# Patient Record
Sex: Male | Born: 1990 | Race: Black or African American | Hispanic: No | Marital: Single | Smoking: Current every day smoker
Health system: Southern US, Community
[De-identification: ages and names within clinical notes are randomized; demographics above are authoritative.]

---

## 2017-06-10 ENCOUNTER — Other Ambulatory Visit: Payer: Self-pay

## 2017-06-10 ENCOUNTER — Emergency Department (HOSPITAL_BASED_OUTPATIENT_CLINIC_OR_DEPARTMENT_OTHER): Payer: Self-pay

## 2017-06-10 ENCOUNTER — Inpatient Hospital Stay (HOSPITAL_BASED_OUTPATIENT_CLINIC_OR_DEPARTMENT_OTHER)
Admission: EM | Admit: 2017-06-10 | Discharge: 2017-06-12 | DRG: 683 | Disposition: A | Discharge status: Home / Self Care | Payer: Self-pay | Attending: Family Medicine | Admitting: Family Medicine

## 2017-06-10 ENCOUNTER — Encounter (HOSPITAL_BASED_OUTPATIENT_CLINIC_OR_DEPARTMENT_OTHER): Payer: Self-pay | Admitting: Emergency Medicine

## 2017-06-10 DIAGNOSIS — N19 Unspecified kidney failure: Secondary | ICD-10-CM

## 2017-06-10 DIAGNOSIS — M6282 Rhabdomyolysis: Secondary | ICD-10-CM | POA: Diagnosis present

## 2017-06-10 DIAGNOSIS — E860 Dehydration: Secondary | ICD-10-CM | POA: Diagnosis present

## 2017-06-10 DIAGNOSIS — F17200 Nicotine dependence, unspecified, uncomplicated: Secondary | ICD-10-CM | POA: Diagnosis present

## 2017-06-10 DIAGNOSIS — N179 Acute kidney failure, unspecified: Principal | ICD-10-CM | POA: Diagnosis present

## 2017-06-10 LAB — CBC WITH DIFFERENTIAL/PLATELET
BASOS ABS: 0.1 10*3/uL (ref 0.0–0.1)
BASOS PCT: 1 %
EOS PCT: 1 %
Eosinophils Absolute: 0.1 10*3/uL (ref 0.0–0.7)
HCT: 50.4 % (ref 39.0–52.0)
Hemoglobin: 18.1 g/dL — ABNORMAL HIGH (ref 13.0–17.0)
Lymphocytes Relative: 23 %
Lymphs Abs: 2.2 10*3/uL (ref 0.7–4.0)
MCH: 32.4 pg (ref 26.0–34.0)
MCHC: 35.9 g/dL (ref 30.0–36.0)
MCV: 90.3 fL (ref 78.0–100.0)
MONO ABS: 0.8 10*3/uL (ref 0.1–1.0)
Monocytes Relative: 8 %
Neutro Abs: 6.5 10*3/uL (ref 1.7–7.7)
Neutrophils Relative %: 67 %
PLATELETS: 308 10*3/uL (ref 150–400)
RBC: 5.58 MIL/uL (ref 4.22–5.81)
RDW: 12.2 % (ref 11.5–15.5)
WBC: 9.7 10*3/uL (ref 4.0–10.5)

## 2017-06-10 LAB — LIPASE, BLOOD: LIPASE: 30 U/L (ref 11–51)

## 2017-06-10 LAB — COMPREHENSIVE METABOLIC PANEL
ALBUMIN: 5.7 g/dL — AB (ref 3.5–5.0)
ALT: 28 U/L (ref 17–63)
ANION GAP: 17 — AB (ref 5–15)
AST: 42 U/L — AB (ref 15–41)
Alkaline Phosphatase: 85 U/L (ref 38–126)
BUN: 18 mg/dL (ref 6–20)
CHLORIDE: 96 mmol/L — AB (ref 101–111)
CO2: 22 mmol/L (ref 22–32)
Calcium: 10.7 mg/dL — ABNORMAL HIGH (ref 8.9–10.3)
Creatinine, Ser: 2.56 mg/dL — ABNORMAL HIGH (ref 0.61–1.24)
GFR calc Af Amer: 38 mL/min — ABNORMAL LOW (ref >60)
GFR, EST NON AFRICAN AMERICAN: 33 mL/min — AB (ref >60)
GLUCOSE: 116 mg/dL — AB (ref 65–99)
POTASSIUM: 3.6 mmol/L (ref 3.5–5.1)
Sodium: 135 mmol/L (ref 135–145)
Total Bilirubin: 1.8 mg/dL — ABNORMAL HIGH (ref 0.3–1.2)
Total Protein: 9.6 g/dL — ABNORMAL HIGH (ref 6.5–8.1)

## 2017-06-10 MED ORDER — ONDANSETRON 4 MG PO TBDP
4.0000 mg | ORAL_TABLET | Freq: Once | ORAL | Status: DC
  Administered 2017-06-10: 4 mg via ORAL
  Filled 2017-06-10: qty 1

## 2017-06-10 MED ORDER — ONDANSETRON HCL 4 MG/2ML IJ SOLN
4.0000 mg | Freq: Once | INTRAMUSCULAR | Status: AC
  Administered 2017-06-10: 4 mg via INTRAVENOUS
  Filled 2017-06-10: qty 2

## 2017-06-10 MED ORDER — FENTANYL CITRATE (PF) 100 MCG/2ML IJ SOLN
50.0000 ug | Freq: Once | INTRAMUSCULAR | Status: AC
  Administered 2017-06-10: 50 ug via INTRAVENOUS
  Filled 2017-06-10: qty 2

## 2017-06-10 MED ORDER — SODIUM CHLORIDE 0.9 % IV BOLUS (SEPSIS)
1000.0000 mL | Freq: Once | INTRAVENOUS | Status: AC
  Administered 2017-06-10: 1000 mL via INTRAVENOUS

## 2017-06-10 NOTE — ED Provider Notes (Signed)
TIME SEEN: 11:12 PM  CHIEF COMPLAINT: Abdominal pain  HPI: Patient is a 27 year old male with no significant past medical history who presents to the emergency department with sharp right lower quadrant pain that started at 6 PM while at work.  Has had nausea but no vomiting.  No diarrhea.  No fevers or chills.  No dysuria or hematuria.  No previous abdominal surgeries.  No history of kidney stones.  No aggravating or alleviating factors.  ROS: See HPI Constitutional: no fever  Eyes: no drainage  ENT: no runny nose   Cardiovascular:  no chest pain  Resp: no SOB  GI: no vomiting GU: no dysuria Integumentary: no rash  Allergy: no hives  Musculoskeletal: no leg swelling  Neurological: no slurred speech ROS otherwise negative  PAST MEDICAL HISTORY/PAST SURGICAL HISTORY:  History reviewed. No pertinent past medical history.  MEDICATIONS:  Prior to Admission medications   Not on File    ALLERGIES:  Allergies  Allergen Reactions  . Penicillins     SOCIAL HISTORY:  Social History   Tobacco Use  . Smoking status: Current Every Day Smoker  . Smokeless tobacco: Never Used  Substance Use Topics  . Alcohol use: Yes    Frequency: Never    FAMILY HISTORY: No family history on file.  EXAM: BP 107/80 (BP Location: Left Arm)   Pulse 85   Temp 97.6 F (36.4 C) (Oral)   Resp 20   Ht 5\' 8"  (1.727 m)   Wt 86.2 kg (190 lb)   SpO2 100%   BMI 28.89 kg/m  CONSTITUTIONAL: Alert and oriented and responds appropriately to questions.  Appears slightly uncomfortable but afebrile and nontoxic-appearing. HEAD: Normocephalic EYES: Conjunctivae clear, pupils appear equal, EOMI ENT: normal nose; moist mucous membranes NECK: Supple, no meningismus, no nuchal rigidity, no LAD  CARD: RRR; S1 and S2 appreciated; no murmurs, no clicks, no rubs, no gallops RESP: Normal chest excursion without splinting or tachypnea; breath sounds clear and equal bilaterally; no wheezes, no rhonchi, no rales,  no hypoxia or respiratory distress, speaking full sentences ABD/GI: Normal bowel sounds; non-distended; soft, tender in the right lower quadrant, no rebound, no guarding, no peritoneal signs, no hepatosplenomegaly BACK:  The back appears normal and is non-tender to palpation, there is no CVA tenderness EXT: Normal ROM in all joints; non-tender to palpation; no edema; normal capillary refill; no cyanosis, no calf tenderness or swelling    SKIN: Normal color for age and race; warm; no rash NEURO: Moves all extremities equally PSYCH: The patient's mood and manner are appropriate. Grooming and personal hygiene are appropriate.  MEDICAL DECISION MAKING: Patient here with right lower quadrant pain.  Sudden onset which makes appendicitis less likely but still in the differential.  Kidney stone, UTI, pyelonephritis also on the differential.  Will obtain labs, urine and a CT of the abdomen pelvis.  Will give IV fluids, pain and nausea medicine.  ED PROGRESS: Patient's urine shows trace hemoglobin, proteinuria.  No sign of infection.  His labs do appear hemoconcentrated and he has a creatinine of 2.56 with a normal BUN.  There is no old for comparison.  His blood pressure is normal.  He has no known history of chronic kidney disease.  CT scan without contrast has now been ordered.  Will avoid any nephrotoxic medications.  We will continue IV hydration.  1:55 AM Pt's CT scan shows no acute abnormality.  No appendicitis.  Kidneys appear normal.  No stones noted.  He denies any known  history of renal failure.  Blood work drawn was in [employer] before 2016.  Denies heavy NSAID use recently.  States he does use ibuprofen intermittently for headaches.  States he has been eating and drinking well.  No vomiting or diarrhea.  We will continue IV hydration.  Have recommended admission for further workup for his renal failure.  Patient and mother comfortable with this plan.  Recommend transfer to [facility].   2:38 AM Discussed patient's case with hospitalist, Dr. Antionette Char.  I have recommended admission and patient (and family if present) agree with this plan. Admitting physician will place admission orders.   I reviewed all nursing notes, vitals, pertinent previous records, EKGs, lab and urine results, imaging (as available).     Ward, Layla Maw, DO 06/11/17 [phone]

## 2017-06-10 NOTE — ED Triage Notes (Signed)
PT presents with c/o RLQ pain since 6pm.

## 2017-06-11 ENCOUNTER — Emergency Department (HOSPITAL_BASED_OUTPATIENT_CLINIC_OR_DEPARTMENT_OTHER): Payer: Self-pay

## 2017-06-11 DIAGNOSIS — N179 Acute kidney failure, unspecified: Secondary | ICD-10-CM | POA: Diagnosis present

## 2017-06-11 DIAGNOSIS — E860 Dehydration: Secondary | ICD-10-CM

## 2017-06-11 DIAGNOSIS — N19 Unspecified kidney failure: Secondary | ICD-10-CM

## 2017-06-11 DIAGNOSIS — R1031 Right lower quadrant pain: Secondary | ICD-10-CM

## 2017-06-11 LAB — URINALYSIS, ROUTINE W REFLEX MICROSCOPIC
Bilirubin Urine: NEGATIVE
Glucose, UA: NEGATIVE mg/dL
Glucose, UA: NEGATIVE mg/dL
HGB URINE DIPSTICK: NEGATIVE
KETONES UR: 20 mg/dL — AB
Ketones, ur: 15 mg/dL — AB
LEUKOCYTES UA: NEGATIVE
Leukocytes, UA: NEGATIVE
NITRITE: NEGATIVE
NITRITE: NEGATIVE
PROTEIN: 100 mg/dL — AB
PROTEIN: NEGATIVE mg/dL
SPECIFIC GRAVITY, URINE: 1.011 (ref 1.005–1.030)
Specific Gravity, Urine: >1.03 — ABNORMAL HIGH (ref 1.005–1.030)
pH: 5.5 (ref 5.0–8.0)
pH: 6 (ref 5.0–8.0)

## 2017-06-11 LAB — COMPREHENSIVE METABOLIC PANEL
ALBUMIN: 3.4 g/dL — AB (ref 3.5–5.0)
ALT: 21 U/L (ref 17–63)
ANION GAP: 10 (ref 5–15)
AST: 32 U/L (ref 15–41)
Alkaline Phosphatase: 60 U/L (ref 38–126)
BILIRUBIN TOTAL: 1.7 mg/dL — AB (ref 0.3–1.2)
BUN: 11 mg/dL (ref 6–20)
CHLORIDE: 106 mmol/L (ref 101–111)
CO2: 21 mmol/L — ABNORMAL LOW (ref 22–32)
Calcium: 8 mg/dL — ABNORMAL LOW (ref 8.9–10.3)
Creatinine, Ser: 1.49 mg/dL — ABNORMAL HIGH (ref 0.61–1.24)
GFR calc Af Amer: >60 mL/min (ref >60)
Glucose, Bld: 103 mg/dL — ABNORMAL HIGH (ref 65–99)
POTASSIUM: 3.1 mmol/L — AB (ref 3.5–5.1)
Sodium: 137 mmol/L (ref 135–145)
TOTAL PROTEIN: 5.9 g/dL — AB (ref 6.5–8.1)

## 2017-06-11 LAB — URINALYSIS, MICROSCOPIC (REFLEX): WBC UA: NONE SEEN WBC/hpf (ref 0–5)

## 2017-06-11 LAB — PROTEIN / CREATININE RATIO, URINE
CREATININE, URINE: 125.14 mg/dL
Total Protein, Urine: <6 mg/dL

## 2017-06-11 LAB — PROTIME-INR
INR: 1.14
Prothrombin Time: 14.5 seconds (ref 11.4–15.2)

## 2017-06-11 LAB — SODIUM, URINE, RANDOM: Sodium, Ur: 37 mmol/L

## 2017-06-11 LAB — CK: CK TOTAL: 1943 U/L — AB (ref 49–397)

## 2017-06-11 LAB — APTT: APTT: 31 s (ref 24–36)

## 2017-06-11 LAB — CREATININE, URINE, RANDOM: Creatinine, Urine: 946.36 mg/dL

## 2017-06-11 MED ORDER — SODIUM CHLORIDE 0.9 % IV SOLN: INTRAVENOUS | Status: DC

## 2017-06-11 MED ORDER — SODIUM CHLORIDE 0.9 % IV BOLUS (SEPSIS)
1000.0000 mL | Freq: Once | INTRAVENOUS | Status: AC
  Administered 2017-06-11: 1000 mL via INTRAVENOUS

## 2017-06-11 MED ORDER — SODIUM CHLORIDE 0.9 % IV BOLUS (SEPSIS)
2000.0000 mL | Freq: Once | INTRAVENOUS | Status: AC
  Administered 2017-06-11: 2000 mL via INTRAVENOUS

## 2017-06-11 MED ORDER — ONDANSETRON HCL 4 MG/2ML IJ SOLN: 4.0000 mg | Freq: Four times a day (QID) | INTRAMUSCULAR | Status: DC | PRN

## 2017-06-11 MED ORDER — ACETAMINOPHEN 325 MG PO TABS: 650.0000 mg | ORAL_TABLET | Freq: Four times a day (QID) | ORAL | Status: DC | PRN

## 2017-06-11 MED ORDER — PROMETHAZINE HCL 25 MG/ML IJ SOLN: 12.5000 mg | Freq: Four times a day (QID) | INTRAMUSCULAR | Status: DC | PRN

## 2017-06-11 MED ORDER — SODIUM CHLORIDE 0.9 % IV SOLN
INTRAVENOUS | Status: DC
  Administered 2017-06-11: 02:00:00 via INTRAVENOUS

## 2017-06-11 MED ORDER — FENTANYL CITRATE (PF) 100 MCG/2ML IJ SOLN: 50.0000 ug | INTRAMUSCULAR | Status: DC | PRN

## 2017-06-11 MED ORDER — NICOTINE 21 MG/24HR TD PT24
21.0000 mg | MEDICATED_PATCH | Freq: Every day | TRANSDERMAL | Status: DC
  Administered 2017-06-11 – 2017-06-12 (×2): 21 mg via TRANSDERMAL
  Filled 2017-06-11 (×2): qty 1

## 2017-06-11 NOTE — ED Notes (Signed)
Phones are down at [facility].

## 2017-06-11 NOTE — Consult Note (Signed)
[facility]  [facility] Consult Note     Date: 06/11/2017                  Patient Name:  Russell Cook  MRN: [id number]  DOB: 09-29-1990  Age / Sex: 27 y.o., male         PCP: Patient, No Pcp Per                 Service Requesting Consult: [facility]                 Reason for Consult: AKI            Chief Complaint: RLQ pain  HPI:  27 year old male with no past medical history presents with acute onset right lower quadrant cramping pain that does not radiate, and associated bilateral upper and lower extremity cramping.  He was found to have an acute kidney injury with creatinine of 2.56 in the emergency department and therefore the renal service was consulted.  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  He endorses drinking 3 x 16 ounces of water yesterday at work, however he did not urinate at work all day.  He reports that he typically does not urinate at work all day because he does not like using the public restroom.  After pain came on he does endorse nausea but no vomiting.  He has not noticed any upper or lower extremity edema.  He denies any recent NSAID use.   The patient reports that his sister is seeing a hematologist for infertility issues, and so question of whether lupus is possible has come up.  Patient does deny any history of rash, photosensitivity, oral ulcers, or arthritis.  He is denying chest pain to think of pleuritis or pericarditis.  He denies family history of clotting disorders. He denies having any symptoms prior to the onset of his cramping right lower quadrant pain, which resolved after receiving fentanyl in the emergency department.  He is not currently having pain.  History reviewed. No pertinent past medical history.  History reviewed. No pertinent surgical history.  No family history on file. Social History:  reports that he has been smoking.  he has never used smokeless  tobacco. He reports that he drinks alcohol. He reports that he uses drugs. Drug: Marijuana.  Allergies:  Allergies  Allergen Reactions  . Penicillins     No medications prior to admission.    Results for orders placed or performed during the hospital encounter of 06/10/17 (from the past 48 hour(s))  Urinalysis, Routine w reflex microscopic     Status: Abnormal   Collection Time: 06/10/17 11:05 PM  Result Value Ref Range   Color, Urine ORANGE (A) YELLOW    Comment: BIOCHEMICALS MAY BE AFFECTED BY COLOR   APPearance CLOUDY (A) CLEAR   Specific Gravity, Urine >1.030 (H) 1.005 - 1.030   pH 5.5 5.0 - 8.0   Glucose, UA NEGATIVE NEGATIVE mg/dL   Hgb urine dipstick TRACE (A) NEGATIVE   Bilirubin Urine MODERATE (A) NEGATIVE   Ketones, ur 15 (A) NEGATIVE mg/dL   Protein, ur 100 (A) NEGATIVE mg/dL   Nitrite NEGATIVE NEGATIVE   Leukocytes, UA NEGATIVE NEGATIVE    Comment: Performed at [facility], [address] [place], [place] [address]  CBC with Differential     Status: Abnormal   Collection Time: 06/10/17 11:05 PM  Result Value  Ref Range   WBC 9.7 4.0 - 10.5 K/uL   RBC 5.58 4.22 - 5.81 MIL/uL   Hemoglobin 18.1 (H) 13.0 - 17.0 g/dL   HCT 50.4 39.0 - 52.0 %   MCV 90.3 78.0 - 100.0 fL   MCH 32.4 26.0 - 34.0 pg   MCHC 35.9 30.0 - 36.0 g/dL   RDW 12.2 11.5 - 15.5 %   Platelets 308 150 - 400 K/uL   Neutrophils Relative % 67 %   Neutro Abs 6.5 1.7 - 7.7 K/uL   Lymphocytes Relative 23 %   Lymphs Abs 2.2 0.7 - 4.0 K/uL   Monocytes Relative 8 %   Monocytes Absolute 0.8 0.1 - 1.0 K/uL   Eosinophils Relative 1 %   Eosinophils Absolute 0.1 0.0 - 0.7 K/uL   Basophils Relative 1 %   Basophils Absolute 0.1 0.0 - 0.1 K/uL    Comment: Performed at [facility], [address] [place], [place] [address]  Comprehensive metabolic panel     Status: Abnormal   Collection Time: 06/10/17 11:05 PM  Result Value Ref Range   Sodium 135 135 - 145 mmol/L   Potassium 3.6 3.5  - 5.1 mmol/L   Chloride 96 (L) 101 - 111 mmol/L   CO2 22 22 - 32 mmol/L   Glucose, Bld 116 (H) 65 - 99 mg/dL   BUN 18 6 - 20 mg/dL   Creatinine, Ser 2.56 (H) 0.61 - 1.24 mg/dL   Calcium 10.7 (H) 8.9 - 10.3 mg/dL   Total Protein 9.6 (H) 6.5 - 8.1 g/dL   Albumin 5.7 (H) 3.5 - 5.0 g/dL   AST 42 (H) 15 - 41 U/L   ALT 28 17 - 63 U/L   Alkaline Phosphatase 85 38 - 126 U/L   Total Bilirubin 1.8 (H) 0.3 - 1.2 mg/dL   GFR calc non Af Amer 33 (L) >60 mL/min   GFR calc Af Amer 38 (L) >60 mL/min    Comment: (NOTE) The eGFR has been calculated using the CKD EPI equation. This calculation has not been validated in all clinical situations. eGFR's persistently <60 mL/min signify possible Chronic Kidney Disease.    Anion gap 17 (H) 5 - 15    Comment: Performed at [facility], [address] [place], [place] [address]  Lipase, blood     Status: None   Collection Time: 06/10/17 11:05 PM  Result Value Ref Range   Lipase 30 11 - 51 U/L    Comment: Performed at [facility], [address] [place], [place] [address]  Urinalysis, Microscopic (reflex)     Status: Abnormal   Collection Time: 06/10/17 11:05 PM  Result Value Ref Range   RBC / HPF 0-5 0 - 5 RBC/hpf   WBC, UA NONE SEEN 0 - 5 WBC/hpf   Bacteria, UA RARE (A) NONE SEEN   Squamous Epithelial / LPF 6-30 (A) NONE SEEN   Hyaline Casts, UA PRESENT    Ca Oxalate Crys, UA PRESENT     Comment: Performed at [facility], [address] [place], [place] [address]   Ct Abdomen Pelvis Wo Contrast  Result Date: 06/11/2017 CLINICAL DATA:  Acute onset of right lower quadrant abdominal pain. EXAM: CT ABDOMEN AND PELVIS WITHOUT CONTRAST TECHNIQUE: Multidetector CT imaging of the abdomen and pelvis was performed following the standard protocol without IV contrast. COMPARISON:  None. FINDINGS: Lower chest: The visualized lung bases are grossly clear.  The visualized portions of the mediastinum are unremarkable.  Hepatobiliary: The liver is unremarkable in appearance. The gallbladder is unremarkable in appearance. The common bile duct remains normal in caliber. Pancreas: The pancreas is within normal limits. Spleen: The spleen is unremarkable in appearance. Adrenals/Urinary Tract: The adrenal glands are unremarkable in appearance. The kidneys are within normal limits. There is no evidence of hydronephrosis. No renal or ureteral stones are identified. No perinephric stranding is seen. A right-sided extrarenal pelvis is noted. Stomach/Bowel: The stomach is unremarkable in appearance. The small bowel is within normal limits. The appendix is normal in caliber, without evidence of appendicitis. The colon is unremarkable in appearance. Vascular/Lymphatic: The abdominal aorta is unremarkable in appearance. The inferior vena cava is grossly unremarkable. No retroperitoneal lymphadenopathy is seen. No pelvic sidewall lymphadenopathy is identified. Reproductive: The bladder is mildly distended and grossly unremarkable. The prostate remains normal in size. Other: No additional soft tissue abnormalities are seen. Musculoskeletal: No acute osseous abnormalities are identified. The visualized musculature is unremarkable in appearance. IMPRESSION: Unremarkable noncontrast CT of the abdomen and pelvis. No evidence of appendicitis. Electronically Signed   By: Garald Balding M.D.   On: 06/11/2017 01:34    ROS See HPI for ROS  Blood pressure (!) 110/54, pulse 72, temperature 98.5 F (36.9 C), temperature source Oral, resp. rate 17, height 5' 8" (1.727 m), weight 190 lb (86.2 kg), SpO2 97 %. Physical Exam  GEN: NAD, rests comfortably in bed, answers questions appropraitely NECK: Full ROM, no thyromegally RESPIRATORY: clear to auscultation bilaterally with no wheezes, rhonchi or rales, good effort CV: RRR, no m/r/g, no peripheral edema in UE or LE bilaterally GI: Soft, mildly tender to very deep palpation in RLQ, otherwise non  tender, non-distended, normoactive bowel sounds, no hepatosplenomegaly. GU: No CVA tenderness SKIN: warm and dry, no rashes or lesions NEURO: II-XII grossly intact PSYCH: AAOx3, appropriate affect  Assessment/Plan 1. AKI: Cr elevated at 2.56 on admission with BUN 18. Electrolytes are WNL. Uncertain etiology of AKI however considerations include rhabdomyolysis given history of physical labor, also urine is orange on UA which is positive for trace Hgb a, 15 ketones and 100 mg/dL protein. Also considering nephrolithiasis - less likely with normal renal CT. Given family history of possible antiphospholipid-Ab syndrome, SLE is on the differential, however patient is not having any symptoms of SLE.  - will await lab results including ANA, CK, CMP from today - continue IV hydration, currently NS at 125 ml/hr - will calculate FeNa when labs result - urine protein:creatinine ratio ordered  Everrett Coombe, MD PGY2

## 2017-06-11 NOTE — H&P (Signed)
History and Physical:    Russell Cook   FAO:[id number] DOB: 04/26/1991 DOA: 06/10/2017  Referring MD/provider: Dr Antionette Char PCP: Patient, No Pcp Per   Patient coming from: Home  Chief Complaint: Right lower quadrant pain and acute renal failure  History of Present Illness:   Russell Cook is an 27 y.o. male with no significant past medical history was in his usual state of health until 6 PM last night when he noted onset of right lower quadrant pain. Patient thought he pulled a muscle try to stretch it out without good effect. Over the course of an hour pain intensified to the point where he had to lie on the floor to fetal position. He was diaphoretic and nauseated but was unable to have any emesis. Notes area of pain increased in size however there was no radiation to the back, to his groin, to his shoulders or to his chest. Notes pain did not resolve until he received fentanyl in the ER. Patient notes that he has had reasonable urine output although does note that his previous urine was dark yellow/orange in color but denies that it was brown or red.  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 He notes that he keeps himself well hydrated. Denies muscle aches or cramps in this job however does note he had to quit his previous job due to frequent cramps in his upper and lower extremities.   Patient's only drug uses marijuana which he smokes one blunt a day. Denies cocaine or heroin use. Denies any IV drug use ever. Does not drink alcohol and has not been sexually active for over a year.  Family history is notable for a sister who is undergoing infertility workup. She is seeing a hematologist. There is no history of clotting disorder or bleeding disorder in the family per mother. No history of unexplained miscarriages. No history of lupus or any other neurological disease.   ED Course:  The patient was treated with fentanyl for pain. He underwent a CT which did not show  appendicitis. He was noted to have an elevated creatinine at 2.6. Patient denied any previous knowledge of kidney issues.  ROS:   ROS   No fevers or chills. No cough. No recent waist changes. No rash. No chest pain or shortness of breath. No dysuria. No back pain. No dizziness or syncope or presyncope. No easy bruisability or bleeding.  Past Medical History:   History reviewed. No pertinent past medical history.  Past Surgical History:   History reviewed. No pertinent surgical history.  Social History:   Social History   Socioeconomic History  . Marital status: Single    Spouse name: Not on file  . Number of children: Not on file  . Years of education: Not on file  . Highest education level: Not on file  Social Needs  . Financial resource strain: Not on file  . Food insecurity - worry: Not on file  . Food insecurity - inability: Not on file  . Transportation needs - medical: Not on file  . Transportation needs - non-medical: Not on file  Occupational History  . Not on file  Tobacco Use  . Smoking status: Current Every Day Smoker  . Smokeless tobacco: Never Used  Substance and Sexual Activity  . Alcohol use: Yes    Frequency: Never  . Drug use: Yes    Types: Marijuana  . Sexual activity: Not on file  Other Topics Concern  .  Not on file  Social History Narrative  . Not on file    Allergies   Penicillins  Family history:   No family history on file.  Current Medications:   Prior to Admission medications   Not on File    Physical Exam:   Vitals:   06/11/17 0308 06/11/17 0406 06/11/17 0554 06/11/17 0653  BP: (!) 95/43 (!) 104/58 (!) 106/49 (!) 110/54  Pulse: 71 62 90 72  Resp: 16 16 16 17   Temp: 98.1 F (36.7 C)  98 F (36.7 C) 98.5 F (36.9 C)  TempSrc: Oral  Oral Oral  SpO2:  97% 98% 97%  Weight:      Height:         Physical Exam: Blood pressure (!) 110/54, pulse 72, temperature 98.5 F (36.9 C), temperature source Oral, resp. rate 17,  height 5\' 8"  (1.727 m), weight 86.2 kg (190 lb), SpO2 97 %. Gen: Well-appearing man lying comfortably in bed watching TV. Eyes: Sclerae anicteric to my exam. Conjunctiva mildly injected. Neck: Supple, no jugular venous distention. Chest: Moderately good air entry bilaterally with no adventitious sounds.  CV: Regular, normal S1 and S2. Abdomen: NABS, soft, nondistended, nontender. He may have had minimal tenderness to deep palpation in the right mid abdomen area. No CVA tenderness. No flank tenderness. No rebound. No guarding. Extremities: No edema.  Skin: No rashes or petechiae noted. Neuro: Alert and oriented times 3; grossly nonfocal. Psych: Patient is cooperative, logical and coherent with appropriate mood and affect.  Data Review:    Labs: Basic Metabolic Panel: Recent Labs  Lab 06/10/17 2305  NA 135  K 3.6  CL 96*  CO2 22  GLUCOSE 116*  BUN 18  CREATININE 2.56*  CALCIUM 10.7*   Liver Function Tests: Recent Labs  Lab 06/10/17 2305  AST 42*  ALT 28  ALKPHOS 85  BILITOT 1.8*  PROT 9.6*  ALBUMIN 5.7*   Recent Labs  Lab 06/10/17 2305  LIPASE 30   No results for input(s): AMMONIA in the last 168 hours. CBC: Recent Labs  Lab 06/10/17 2305  WBC 9.7  NEUTROABS 6.5  HGB 18.1*  HCT 50.4  MCV 90.3  PLT 308   Cardiac Enzymes: No results for input(s): CKTOTAL, CKMB, CKMBINDEX, TROPONINI in the last 168 hours.  BNP (last 3 results) No results for input(s): PROBNP in the last 8760 hours. CBG: No results for input(s): GLUCAP in the last 168 hours.  Urinalysis    Component Value Date/Time   COLORURINE ORANGE (A) 06/10/2017 2305   APPEARANCEUR CLOUDY (A) 06/10/2017 2305   LABSPEC >1.030 (H) 06/10/2017 2305   PHURINE 5.5 06/10/2017 2305   GLUCOSEU NEGATIVE 06/10/2017 2305   HGBUR TRACE (A) 06/10/2017 2305   BILIRUBINUR MODERATE (A) 06/10/2017 2305   KETONESUR 15 (A) 06/10/2017 2305   PROTEINUR 100 (A) 06/10/2017 2305   NITRITE NEGATIVE 06/10/2017 2305    LEUKOCYTESUR NEGATIVE 06/10/2017 2305      Radiographic Studies: Ct Abdomen Pelvis Wo Contrast  Result Date: 06/11/2017 CLINICAL DATA:  Acute onset of right lower quadrant abdominal pain. EXAM: CT ABDOMEN AND PELVIS WITHOUT CONTRAST TECHNIQUE: Multidetector CT imaging of the abdomen and pelvis was performed following the standard protocol without IV contrast. COMPARISON:  None. FINDINGS: Lower chest: The visualized lung bases are grossly clear. The visualized portions of the mediastinum are unremarkable. Hepatobiliary: The liver is unremarkable in appearance. The gallbladder is unremarkable in appearance. The common bile duct remains normal in caliber. Pancreas: The pancreas is  within normal limits. Spleen: The spleen is unremarkable in appearance. Adrenals/Urinary Tract: The adrenal glands are unremarkable in appearance. The kidneys are within normal limits. There is no evidence of hydronephrosis. No renal or ureteral stones are identified. No perinephric stranding is seen. A right-sided extrarenal pelvis is noted. Stomach/Bowel: The stomach is unremarkable in appearance. The small bowel is within normal limits. The appendix is normal in caliber, without evidence of appendicitis. The colon is unremarkable in appearance. Vascular/Lymphatic: The abdominal aorta is unremarkable in appearance. The inferior vena cava is grossly unremarkable. No retroperitoneal lymphadenopathy is seen. No pelvic sidewall lymphadenopathy is identified. Reproductive: The bladder is mildly distended and grossly unremarkable. The prostate remains normal in size. Other: No additional soft tissue abnormalities are seen. Musculoskeletal: No acute osseous abnormalities are identified. The visualized musculature is unremarkable in appearance. IMPRESSION: Unremarkable noncontrast CT of the abdomen and pelvis. No evidence of appendicitis. Electronically Signed   By: Roanna RaiderJeffery  Chang M.D.   On: 06/11/2017 01:34    EKG: not ordered    Assessment/Plan:   Principal Problem:   Renal failure  ACUTE RENAL FAILURE 27 year old otherwise healthy male presents with sudden onset right mid quadrant pain and was noted to have elevated creatinine. Patient denies any known history of renal disease. Of note patient's sister is undergoing infertility workup by seeing a hematologist. Laboratory data is not entirely revealing. UA is without evidence of ATN although he is clearly dehydrated with a high specific gravity. There does not appear to be a gamma gap and although patient does have elevated albumin and protein. Noncontrast CT scan was negative.  I will hydrate this patient aggressively. Repeat labs this morning. Urine sent for sodium and creatinine. Repeat UA. Renal consultation requested. Further workup per their recommendations.    Other information:   DVT prophylaxis: Lovenox ordered. Code Status: Full code. Family Communication: Patient's mom was at bedside throughout Disposition Plan: Home Consults called: Renal Dr. Allena KatzPatel Admission status: Inpatient   Horatio PelSrobona Orma Flamingublu Chatterjee [facility] Pager [phone] Cell: [phone]   If 7PM-7AM, please contact night-coverage [email or web address] Password [email or web address] 06/11/2017, 9:41 AM

## 2017-06-11 NOTE — ED Notes (Signed)
Called [facility], the line has been busy for 15 minutes.

## 2017-06-12 DIAGNOSIS — N179 Acute kidney failure, unspecified: Principal | ICD-10-CM

## 2017-06-12 LAB — RENAL FUNCTION PANEL
ANION GAP: 7 (ref 5–15)
Albumin: 3.2 g/dL — ABNORMAL LOW (ref 3.5–5.0)
BUN: 5 mg/dL — ABNORMAL LOW (ref 6–20)
CO2: 21 mmol/L — AB (ref 22–32)
Calcium: 8.2 mg/dL — ABNORMAL LOW (ref 8.9–10.3)
Chloride: 109 mmol/L (ref 101–111)
Creatinine, Ser: 1.12 mg/dL (ref 0.61–1.24)
GFR calc Af Amer: >60 mL/min (ref >60)
GFR calc non Af Amer: >60 mL/min (ref >60)
Glucose, Bld: 102 mg/dL — ABNORMAL HIGH (ref 65–99)
POTASSIUM: 3.3 mmol/L — AB (ref 3.5–5.1)
Phosphorus: 2.2 mg/dL — ABNORMAL LOW (ref 2.5–4.6)
SODIUM: 137 mmol/L (ref 135–145)

## 2017-06-12 LAB — MAGNESIUM: Magnesium: 1.8 mg/dL (ref 1.7–2.4)

## 2017-06-12 LAB — ANA W/REFLEX IF POSITIVE: Anti Nuclear Antibody(ANA): NEGATIVE

## 2017-06-12 LAB — HIV ANTIBODY (ROUTINE TESTING W REFLEX): HIV Screen 4th Generation wRfx: NONREACTIVE

## 2017-06-12 LAB — UREA NITROGEN, URINE: UREA NITROGEN UR: 446 mg/dL

## 2017-06-12 MED ORDER — POTASSIUM CHLORIDE CRYS ER 20 MEQ PO TBCR
30.0000 meq | EXTENDED_RELEASE_TABLET | Freq: Once | ORAL | Status: AC
  Administered 2017-06-12: 14:00:00 30 meq via ORAL
  Filled 2017-06-12: qty 1

## 2017-06-12 MED ORDER — K PHOS MONO-SOD PHOS DI & MONO 155-852-130 MG PO TABS
500.0000 mg | ORAL_TABLET | Freq: Once | ORAL | Status: AC
  Administered 2017-06-12: 500 mg via ORAL
  Filled 2017-06-12: qty 2

## 2017-06-12 MED ORDER — POTASSIUM CHLORIDE CRYS ER 20 MEQ PO TBCR
40.0000 meq | EXTENDED_RELEASE_TABLET | Freq: Once | ORAL | Status: AC
  Administered 2017-06-12: 40 meq via ORAL
  Filled 2017-06-12: qty 2

## 2017-06-12 MED ORDER — NICOTINE 21 MG/24HR TD PT24: 21.0000 mg | MEDICATED_PATCH | Freq: Every day | TRANSDERMAL | 0 refills | Status: DC

## 2017-06-12 MED ORDER — POTASSIUM PHOSPHATE MONOBASIC 500 MG PO TABS
500.0000 mg | ORAL_TABLET | Freq: Once | ORAL | Status: DC
  Filled 2017-06-12: qty 1

## 2017-06-12 NOTE — Discharge Instructions (Addendum)
Get lab work by next Friday. Basic Metabolic Panel Why am I having this test? A basic metabolic panel measures levels of the following substances in your blood:  Glucose. Glucose is a simple sugar that serves as the main source of energy for your body.  Creatinine. Creatinine is a waste product of normal muscle activity. It is excreted from the body by the kidneys.  Blood urea nitrogen (BUN). Urea nitrogen is a waste product of protein breakdown. It is produced when excess protein in your body is broken down and used for energy. It is excreted by the kidneys.  Electrolytes. Electrolytes are negatively or positively charged particles that are dissolved in the water of different body compartments. This includes the serum portion of blood, water inside cells, and water outside cells. Concentrations of electrolytes vary among the different fluid compartments. Electrolytes are tightly regulated to maintain a salt-water and acid-base balance in the body. The electrolytes measured in a basic metabolic panel include: ? Potassium. ? Sodium. ? Chloride. ? Calcium. ? Bicarbonate.  What kind of sample is taken? A blood sample is required for this test. It is usually collected by inserting a needle into a vein. How do I prepare for this test? Your health care provider may ask you to avoid eating or drinking anything before your blood sample is taken. Do not eat or drink anything after midnight on the night before the procedure or as directed by your health care provider. What are the reference ranges? Reference ranges are considered healthy ranges established after testing a large group of healthy people. Reference ranges may vary among different people, labs, and hospitals. It is your responsibility to obtain your test results. Ask the lab or department performing the test when and how you will get your results. The following are reference ranges for each part of a basic metabolic panel: Glucose  Cord:  45-96 mg/dL or 1.6-1.02.5-5.3 mmol/L (SI units).  Premature infant: 20-60 mg/dL or 9.6-0.41.1-3.3 mmol/L.  Neonate: 30-60 mg/dL or 5.4-0.91.7-3.3 mmol/L.  Infant: 40-90 mg/dL or 8.1-12.2-5 mmol/L.  Child under 26 years old: 60-100 mg/dL or 9.1-4.73.3-5.5 mmol/L.  Adult or child over 26 years old: ? Fasting: 70-110 mg/dL or less than 6.1 mmol/L. ? Random (nonfasting or casual): less than or equal to 200 mg/dL or less than 82.911.1 mmol/L.  Elderly: increase in normal range after age 26 years. Creatinine  Child under 26 years old: 0.1-0.4 mg/dL.  Child 762-26 years old: 0.2-0.5 mg/dL.  Child 266-26 years old: 0.3-0.6 mg/dL.  Child or adolescent 8510-26 years old: 0.4-1 mg/dL.  Adult 1218-26 years old: ? Male: 0.5-1 mg/dL. ? Male: 0.6-1.2 mg/dL.  Adult 1941-26 years old: ? Male: 0.5-1.1 mg/dL. ? Male: 0.6-1.3 mg/dL.  Adult 26 years old and above: ? Male: 0.5-1.2 mg/dL. ? Male: 0.7-1.3 mg/dL. BUN  Cord: 21-40 mg/dL.  Newborn: 3-12 mg/dL.  Infant: 5-18 mg/dL.  Child: 5-18 mg/dL.  Adult: 10-20 mg/dL or 5.6-2.13.6-7.1 mmol/L (SI units).  Elderly: may be slightly higher than adult. Potassium  Newborn: 3.9-5.9 mEq/L.  Infant: 4.1-5.3 mEq/L.  Child: 3.4-4.7 mEq/L.  Adult or elderly: 3.5-5 mEq/L or 3.5-5 mmol/L (SI units). Sodium  Newborn: 134-144 mEq/L.  Infant: 134-150 mEq/L.  Child: 136-145 mEq/L.  Adult or elderly: 136-145 mEq/L or 136-145 mmol/L (SI units). Chloride  Premature infant: 95-110 mEq/L.  Newborn: 96-106 mEq/L.  Child: 90-110 mEq/L.  Adult or elderly: 98-106 mEq/L or 98-106 mmol/L (SI units). Calcium  Total calcium: ? Newborn under 4910 days old: 7.6-10.4 mg/dL or  1.9-2.6 mmol/L. ? Umbilical: 9-11.5 mg/dL or 1.61-0.96 mmol/L. ? 12 days to 26 years old: 9-10.6 mg/dL or 0.45-4.09 mmol/L. ? Child: 8.8-10.8 mg/dL or 8.1-1.9 mmol/L. ? Adult: 9-10.5 mg/dL or 1.47-8.29 mmol/L.  Ionized calcium: ? Newborn: 4.20-5.58 mg/dL or 5.62-1.30 mmol/L. ? 2 months to 26 years old: 4.80-5.52 mg/dL or  8.65-7.84 mmol/L. ? Adult: 4.5-5.6 mg/dL or 6.96-2.95 mmol/L. Bicarbonate  Newborn: 13-22 mEq/L.  Infant: 20-28 mEq/L.  Child: 20-28 mEq/L.  Adult or elderly: 23-30 mEq/L or 23-30 mmol/L (SI units). What do the results mean? Diet and levels of activity can have an effect on your test results. Sometimes they can be the cause of values that are outside of normal limits. However, sometimes values outside normal limits can indicate a medical disorder.  Glucose: ? Abnormally high glucose levels (hyperglycemia) are usually associated with prediabetes mellitus and diabetes mellitus. They can also occur with severe stress on the body. This can come from surgery or events such as stroke or trauma. Overactive thyroid gland and pancreatitis or pancreatic cancer can also cause abnormally high glucose levels. ? Abnormally low glucose levels (hypoglycemia) can occur with underactive thyroid gland and rare insulin-secreting tumors (insulinoma).  Creatinine: ? Abnormally high creatinine levels are most commonly seen in kidney failure. They can also be seen with overactive thyroid (hyperthyroidism), conditions related to overgrowth of the body, abnormal breakdown of muscle tissue, and early muscular dystrophy. ? Abnormally low creatinine levels can indicate low muscle mass associated with malnutrition or late-stage muscular dystrophy.  BUN: ? Abnormally high BUN levels generally mean that your kidneys are not functioning normally. ? Abnormally low BUN levels can be seen with malnutrition and liver failure.  Potassium: ? Abnormally high potassium levels are most often seen with kidney disease, massive destruction of red blood cells, and adrenal gland failure. ? Abnormally low potassium levels are seen with excessive levels of the hormone aldosterone.  Sodium: ? Abnormally high sodium levels can be seen with dehydration, excessive thirst, and urination due to abnormally low levels of antidiuretic hormone  (diabetes insipidus). They can also be seen with excessive levels of aldosterone or cortisol in the body. ? Abnormally low levels of sodium can be seen with congestive heart failure, cirrhosis of the liver, kidney failure, and the syndrome of inappropriate antidiuretic hormone (SIADH). ? Chloride: ? Abnormally high levels of chloride can be seen with acute kidney failure, diabetes insipidus, prolonged diarrhea, and poisoning with aspirin or bromide. ? Abnormally low levels of chloride can be seen with prolonged vomiting, acute adrenal gland failure, and SIADH.  Calcium: ? Abnormally high levels of calcium can occur with excessive activity of the parathyroid glands, certain cancers, and a type of inflammation seen in sarcoidosis and tuberculosis. ? Abnormally low levels of calcium can be seen with underactive parathyroid glands, vitamin D deficiency, and acute pancreatitis.  Bicarbonate: ? Abnormally high bicarbonate levels are seen after prolonged vomiting and diuretic therapy, which lead to a decrease in the amount of acid in the body (metabolic alkalosis). They can also be seen in conditions that increase the amount of bicarbonate in the body. These conditions include rare hereditary disorders that interfere with how your kidneys handle electrolytes. ? Abnormally low bicarbonate levels are seen with conditions that cause your body to produce too much acid (metabolic acidosis). These conditions include uncontrolled diabetes mellitus and poisoning with aspirin, methanol, or antifreeze (ethylene glycol).  Talk with your health care provider to discuss your results, treatment options, and if necessary, the need for  more tests. Talk with your health care provider if you have any questions about your results. Talk with your health care provider to discuss your results, treatment options, and if necessary, the need for more tests. Talk with your health care provider if you have any questions about your  results. This information is not intended to replace advice given to you by your health care provider. Make sure you discuss any questions you have with your health care provider. Document Released: 05/13/2004 Document Revised: 12/23/2015 Document Reviewed: 09/18/2013 Elsevier Interactive Patient Education  [employer].

## 2017-06-12 NOTE — Discharge Summary (Signed)
Physician Discharge Summary  Russell Cook ZOX:[id number] DOB: 28-May-1990 DOA: 06/10/2017  PCP: Patient, No Pcp Per  Admit date: 06/10/2017 Discharge date: 06/12/2017  Time spent: 35 minutes  Recommendations for Outpatient Follow-up:  1. F/u with PCP in 1 week for labs   Discharge Diagnoses:  Principal Problem:   Renal failure   Discharge Condition: Stable  Diet recommendation: Regular  Filed Weights   06/10/17 2256  Weight: 86.2 kg (190 lb)    History of present illness:  Russell Cook is an 27 y.o. male with no significant past medical history was in his usual state of health until 6 PM last night when he noted onset of right lower quadrant pain. Patient thought he pulled a muscle try to stretch it out without good effect. Over the course of an hour pain intensified to the point where he had to lie on the floor to fetal position. He was diaphoretic and nauseated but was unable to have any emesis. Notes area of pain increased in size however there was no radiation to the back, to his groin, to his shoulders or to his chest. Notes pain did not resolve until he received fentanyl in the ER. Patient notes that he has had reasonable urine output although does note that his previous urine was dark yellow/orange in color but denies that it was brown or red.  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 He notes that he keeps himself well hydrated. Denies muscle aches or cramps in this job however does note he had to quit his previous job due to frequent cramps in his upper and lower extremities.   Patient's only drug uses marijuana which he smokes one blunt a day. Denies cocaine or heroin use. Denies any IV drug use ever. Does not drink alcohol and has not been sexually active for over a year.  Family history is notable for a sister who is undergoing infertility workup. She is seeing a hematologist. There is no history of clotting disorder or bleeding disorder in the family  per mother. No history of unexplained miscarriages. No history of lupus or any other neurological disease.    Hospital Course:  Patient started on IV fluids.  UA done and did not show infection.  Did show high specific gravity represented dehydration.  Noncontrast CT of abdomen pelvis did not show any renal abnormalities.  Ureter and bladder are also normal.  Patient made urine throughout his stay.  Gentle IV fluids given.  Creatinine returned to baseline.  Patient was seen by nephrology & cleared for d/c home.  They signed off after patient's creatinine normalized.  Patient discharged home.  Procedures:  ---  Consultations:  Nephro  Discharge Exam: Vitals:   06/12/17 0557 06/12/17 0700  BP: (!) 100/48 (!) 121/58  Pulse: (!) 59 (!) 58  Resp:  20  Temp: 98.7 F (37.1 C) 98.8 F (37.1 C)  SpO2: 99% 99%    General: NCAT Cardiovascular: RRR, no MRG Respiratory: CTAB, nl wob  Discharge Instructions   Discharge Instructions    Ambulatory referral to Family Practice   Complete by:  As directed    New Patient   Call MD for:   Complete by:  As directed    Decreased urine output   Call MD for:  extreme fatigue   Complete by:  As directed    Call MD for:  persistant dizziness or light-headedness   Complete by:  As directed    Call MD for:  persistant nausea and vomiting   Complete by:  As directed    Call MD for:  redness, tenderness, or signs of infection (pain, swelling, redness, odor or green/yellow discharge around incision site)   Complete by:  As directed    Call MD for:  temperature >100.4   Complete by:  As directed    Diet - low sodium heart healthy   Complete by:  As directed    Discharge instructions   Complete by:  As directed    Stay hydrated   Increase activity slowly   Complete by:  As directed      Allergies as of 06/12/2017      Reactions   Penicillins Other (See Comments)   Unknown reaction per pt      Medication List    TAKE these medications    nicotine 21 mg/24hr patch Commonly known as:  NICODERM CQ - dosed in mg/24 hours Place 1 patch (21 mg total) onto the skin daily. Start taking on:  06/13/2017      Allergies  Allergen Reactions  . Penicillins Other (See Comments)    Unknown reaction per pt      The results of significant diagnostics from this hospitalization (including imaging, microbiology, ancillary and laboratory) are listed below for reference.    Significant Diagnostic Studies: Ct Abdomen Pelvis Wo Contrast  Result Date: 06/11/2017 CLINICAL DATA:  Acute onset of right lower quadrant abdominal pain. EXAM: CT ABDOMEN AND PELVIS WITHOUT CONTRAST TECHNIQUE: Multidetector CT imaging of the abdomen and pelvis was performed following the standard protocol without IV contrast. COMPARISON:  None. FINDINGS: Lower chest: The visualized lung bases are grossly clear. The visualized portions of the mediastinum are unremarkable. Hepatobiliary: The liver is unremarkable in appearance. The gallbladder is unremarkable in appearance. The common bile duct remains normal in caliber. Pancreas: The pancreas is within normal limits. Spleen: The spleen is unremarkable in appearance. Adrenals/Urinary Tract: The adrenal glands are unremarkable in appearance. The kidneys are within normal limits. There is no evidence of hydronephrosis. No renal or ureteral stones are identified. No perinephric stranding is seen. A right-sided extrarenal pelvis is noted. Stomach/Bowel: The stomach is unremarkable in appearance. The small bowel is within normal limits. The appendix is normal in caliber, without evidence of appendicitis. The colon is unremarkable in appearance. Vascular/Lymphatic: The abdominal aorta is unremarkable in appearance. The inferior vena cava is grossly unremarkable. No retroperitoneal lymphadenopathy is seen. No pelvic sidewall lymphadenopathy is identified. Reproductive: The bladder is mildly distended and grossly unremarkable. The prostate  remains normal in size. Other: No additional soft tissue abnormalities are seen. Musculoskeletal: No acute osseous abnormalities are identified. The visualized musculature is unremarkable in appearance. IMPRESSION: Unremarkable noncontrast CT of the abdomen and pelvis. No evidence of appendicitis. Electronically Signed   By: Roanna Raider M.D.   On: 06/11/2017 01:34    Microbiology: No results found for this or any previous visit (from the past 240 hour(s)).   Labs: Basic Metabolic Panel: Recent Labs  Lab 06/10/17 2305 06/11/17 1030 06/12/17 0800 06/12/17 0923  NA 135 137 137  --   K 3.6 3.1* 3.3*  --   CL 96* 106 109  --   CO2 22 21* 21*  --   GLUCOSE 116* 103* 102*  --   BUN 18 11 5*  --   CREATININE 2.56* 1.49* 1.12  --   CALCIUM 10.7* 8.0* 8.2*  --   MG  --   --   --  1.8  PHOS  --   --  2.2*  --    Liver Function Tests: Recent Labs  Lab 06/10/17 2305 06/11/17 1030 06/12/17 0800  AST 42* 32  --   ALT 28 21  --   ALKPHOS 85 60  --   BILITOT 1.8* 1.7*  --   PROT 9.6* 5.9*  --   ALBUMIN 5.7* 3.4* 3.2*   Recent Labs  Lab 06/10/17 2305  LIPASE 30   No results for input(s): AMMONIA in the last 168 hours. CBC: Recent Labs  Lab 06/10/17 2305  WBC 9.7  NEUTROABS 6.5  HGB 18.1*  HCT 50.4  MCV 90.3  PLT 308   Cardiac Enzymes: Recent Labs  Lab 06/11/17 1030  CKTOTAL 1,943*   BNP: BNP (last 3 results) No results for input(s): BNP in the last 8760 hours.  ProBNP (last 3 results) No results for input(s): PROBNP in the last 8760 hours.  CBG: No results for input(s): GLUCAP in the last 168 hours.     Signed:  Haydee SalterPhillip M Hobbs MD  FACP  [facility] 06/12/2017, 12:54 PM

## 2017-06-12 NOTE — Progress Notes (Signed)
Russell Cook Progress Note    Assessment/ Plan:   1. AKI: Cr elevated at 2.56 on admission with BUN 18, improved to normal with IV fluids. Electrolytes are WNL. Likely prerenal etiology with dehydration. Given family history of possible antiphospholipid-Ab syndrome, SLE is on the differential, however patient is not having any symptoms of SLE.  - will await lab results including ANA, CK, CMP from today - continue IV hydration, currently NS at 125 ml/hr - will calculate FeNa when labs result - urine protein:creatinine ratio below detectable level  2. Rhabdomyolysis: managed by primary team, IVF  Subjective:   Patient did well overnight with no acute events. Feels his abdominal pain and muscle pain is improving.   Objective:   BP (!) 121/58 (BP Location: Right Arm)   Pulse (!) 58   Temp 98.8 F (37.1 C) (Oral)   Resp 20   Ht 5\' 8"  (1.727 m)   Wt 190 lb (86.2 kg)   SpO2 99%   BMI 28.89 kg/m   Intake/Output Summary (Last 24 hours) at 06/12/2017 1028 Last data filed at 06/11/2017 1714 Gross per 24 hour  Intake 3832.08 ml  Output 1000 ml  Net 2832.08 ml   Weight change:   Physical Exam: Gen:NAD, rests comfortagbly in bed CVS:RRR, no m/r/g Resp:CTA bil, no W/R/R ZOX:WRUE, nt, nd Ext:no LE edema or tenderness  Imaging: Ct Abdomen Pelvis Wo Contrast  Result Date: 06/11/2017 CLINICAL DATA:  Acute onset of right lower quadrant abdominal pain. EXAM: CT ABDOMEN AND PELVIS WITHOUT CONTRAST TECHNIQUE: Multidetector CT imaging of the abdomen and pelvis was performed following the standard protocol without IV contrast. COMPARISON:  None. FINDINGS: Lower chest: The visualized lung bases are grossly clear. The visualized portions of the mediastinum are unremarkable. Hepatobiliary: The liver is unremarkable in appearance. The gallbladder is unremarkable in appearance. The common bile duct remains normal in caliber. Pancreas: The pancreas is within normal limits. Spleen: The  spleen is unremarkable in appearance. Adrenals/Urinary Tract: The adrenal glands are unremarkable in appearance. The kidneys are within normal limits. There is no evidence of hydronephrosis. No renal or ureteral stones are identified. No perinephric stranding is seen. A right-sided extrarenal pelvis is noted. Stomach/Bowel: The stomach is unremarkable in appearance. The small bowel is within normal limits. The appendix is normal in caliber, without evidence of appendicitis. The colon is unremarkable in appearance. Vascular/Lymphatic: The abdominal aorta is unremarkable in appearance. The inferior vena cava is grossly unremarkable. No retroperitoneal lymphadenopathy is seen. No pelvic sidewall lymphadenopathy is identified. Reproductive: The bladder is mildly distended and grossly unremarkable. The prostate remains normal in size. Other: No additional soft tissue abnormalities are seen. Musculoskeletal: No acute osseous abnormalities are identified. The visualized musculature is unremarkable in appearance. IMPRESSION: Unremarkable noncontrast CT of the abdomen and pelvis. No evidence of appendicitis. Electronically Signed   By: Roanna Raider M.D.   On: 06/11/2017 01:34    Labs: BMET Recent Labs  Lab 06/10/17 2305 06/11/17 1030 06/12/17 0800  NA 135 137 137  K 3.6 3.1* 3.3*  CL 96* 106 109  CO2 22 21* 21*  GLUCOSE 116* 103* 102*  BUN 18 11 5*  CREATININE 2.56* 1.49* 1.12  CALCIUM 10.7* 8.0* 8.2*  PHOS  --   --  2.2*   CBC Recent Labs  Lab 06/10/17 2305  WBC 9.7  NEUTROABS 6.5  HGB 18.1*  HCT 50.4  MCV 90.3  PLT 308    Medications:    . nicotine  21 mg  Transdermal Daily  . potassium chloride  40 mEq Oral Once      Russell PouchLauren Kaleab Frasier, MD PGY2 06/12/2017, 10:28 AM

## 2017-06-18 ENCOUNTER — Ambulatory Visit: Payer: Self-pay | Admitting: Internal Medicine

## 2017-06-18 VITALS — BP 102/53 | HR 77 | Temp 98.2°F | Ht 68.0 in | Wt 186.0 lb

## 2017-06-18 DIAGNOSIS — Z0189 Encounter for other specified special examinations: Secondary | ICD-10-CM

## 2017-06-18 DIAGNOSIS — F17200 Nicotine dependence, unspecified, uncomplicated: Secondary | ICD-10-CM

## 2017-06-18 DIAGNOSIS — Z87448 Personal history of other diseases of urinary system: Secondary | ICD-10-CM

## 2017-06-18 DIAGNOSIS — N179 Acute kidney failure, unspecified: Secondary | ICD-10-CM

## 2017-06-18 DIAGNOSIS — F17210 Nicotine dependence, cigarettes, uncomplicated: Secondary | ICD-10-CM

## 2017-06-18 LAB — GLUCOSE, CAPILLARY: GLUCOSE-CAPILLARY: 91 mg/dL (ref 65–99)

## 2017-06-18 LAB — POCT GLYCOSYLATED HEMOGLOBIN (HGB A1C): Hemoglobin A1C: 5.1

## 2017-06-18 NOTE — Patient Instructions (Addendum)
It was a pleasure to meet you today [name]  - For your kidney problem - we are checking labs today, someone from our clinic will call to let you know the results of these  Continue to drink plenty of water, at least 8 glasses daily  - Please call our clinic if you have any problems or questions, we may be able to help you and keep you from a long emergency room wait. Our clinic and after hours phone number is [phone]   Schedule an appointment with Russell Cook to sign up for the orange cards or take advantage of open enrollment for [employer] care   Schedule a follow-up visit for a checkup in 1 year    Visit 1800quit line if you change your mind about stopping smoking       Mediterranean Diet  Why follow it? Research shows. . Those who follow the Mediterranean diet have a reduced risk of heart disease  . The diet is associated with a reduced incidence of Parkinson's and Alzheimer's diseases . People following the diet may have longer life expectancies and lower rates of chronic diseases  . The Dietary Guidelines for Americans recommends the Mediterranean diet as an eating plan to promote health and prevent disease  What Is the Mediterranean Diet?  . Healthy eating plan based on typical foods and recipes of Mediterranean-style cooking . The diet is primarily a plant based diet; these foods should make up a majority of meals   Starches - Plant based foods should make up a majority of meals - They are an important sources of vitamins, minerals, energy, antioxidants, and fiber - Choose whole grains, foods high in fiber and minimally processed items  - Typical grain sources include wheat, oats, barley, corn, brown rice, bulgar, farro, millet, polenta, couscous  - Various types of beans include chickpeas, lentils, fava beans, black beans, white beans   Fruits  Veggies - Large quantities of antioxidant rich fruits & veggies; 6 or more servings  - Vegetables can be eaten raw or lightly  drizzled with oil and cooked  - Vegetables common to the traditional Mediterranean Diet include: artichokes, arugula, beets, broccoli, brussel sprouts, cabbage, carrots, celery, collard greens, cucumbers, eggplant, kale, leeks, lemons, lettuce, mushrooms, okra, onions, peas, peppers, potatoes, pumpkin, radishes, rutabaga, shallots, spinach, sweet potatoes, turnips, zucchini - Fruits common to the Mediterranean Diet include: apples, apricots, avocados, cherries, clementines, dates, figs, grapefruits, grapes, melons, nectarines, oranges, peaches, pears, pomegranates, strawberries, tangerines  Fats - Replace butter and margarine with healthy oils, such as olive oil, canola oil, and tahini  - Limit nuts to no more than a handful a day  - Nuts include walnuts, almonds, pecans, pistachios, pine nuts  - Limit or avoid candied, honey roasted or heavily salted nuts - Olives are central to the [employer]iterranean diet - can be eaten whole or used in a variety of dishes   Meats Protein - Limiting red meat: no more than a few times a month - When eating red meat: choose lean cuts and keep the portion to the size of deck of cards - Eggs: approx. 0 to 4 times a week  - Fish and lean poultry: at least 2 a week  - Healthy protein sources include, chicken, [place], lean beef, lamb - Increase intake of seafood such as tuna, salmon, trout, mackerel, shrimp, scallops - Avoid or limit high fat processed meats such as sausage and bacon  Dairy - Include moderate amounts of low fat dairy products  -  Focus on healthy dairy such as fat free yogurt, skim milk, low or reduced fat cheese - Limit dairy products higher in fat such as whole or 2% milk, cheese, ice cream  Alcohol - Moderate amounts of red wine is ok  - No more than 5 oz daily for women (all ages) and men older than age 90  - No more than 10 oz of wine daily for men younger than 29  Other - Limit sweets and other desserts  - Use herbs and spices instead of salt to  flavor foods  - Herbs and spices common to the traditional Mediterranean Diet include: basil, bay leaves, chives, cloves, cumin, fennel, garlic, lavender, marjoram, mint, oregano, parsley, pepper, rosemary, sage, savory, sumac, tarragon, thyme   It's not just a diet, it's a lifestyle:  . The Mediterranean diet includes lifestyle factors typical of those in the region  . Foods, drinks and meals are best eaten with others and savored . Daily physical activity is important for overall good health . This could be strenuous exercise like running and aerobics . This could also be more leisurely activities such as walking, housework, yard-work, or taking the stairs . Moderation is the key; a balanced and healthy diet accommodates most foods and drinks . Consider portion sizes and frequency of consumption of certain foods   Meal Ideas & Options:  . Breakfast:  o Whole wheat toast or whole wheat English muffins with peanut butter & hard boiled egg o Steel cut oats topped with apples & cinnamon and skim milk  o Fresh fruit: banana, strawberries, melon, berries, peaches  o Smoothies: strawberries, bananas, greek yogurt, peanut butter o Low fat greek yogurt with blueberries and granola  o Egg white omelet with spinach and mushrooms o Breakfast couscous: whole wheat couscous, apricots, skim milk, cranberries  . Sandwiches:  o Hummus and grilled vegetables (peppers, zucchini, squash) on whole wheat bread   o Grilled chicken on whole wheat pita with lettuce, tomatoes, cucumbers or tzatziki  o Tuna salad on whole wheat bread: tuna salad made with greek yogurt, olives, red peppers, capers, green onions o Garlic rosemary lamb pita: lamb sauted with garlic, rosemary, salt & pepper; add lettuce, cucumber, greek yogurt to pita - flavor with lemon juice and black pepper  . Seafood:  o Mediterranean grilled salmon, seasoned with garlic, basil, parsley, lemon juice and black pepper o Shrimp, lemon, and spinach  whole-grain pasta salad made with low fat greek yogurt  o Seared scallops with lemon orzo  o Seared tuna steaks seasoned salt, pepper, coriander topped with tomato mixture of olives, tomatoes, olive oil, minced garlic, parsley, green onions and cappers  . Meats:  o Herbed greek chicken salad with kalamata olives, cucumber, feta  o Red bell peppers stuffed with spinach, bulgur, lean ground beef (or lentils) & topped with feta   o Kebabs: skewers of chicken, tomatoes, onions, zucchini, squash  o [place] burgers: made with red onions, mint, dill, lemon juice, feta cheese topped with roasted red peppers . Vegetarian o Cucumber salad: cucumbers, artichoke hearts, celery, red onion, feta cheese, tossed in olive oil & lemon juice  o Hummus and whole grain pita points with a greek salad (lettuce, tomato, feta, olives, cucumbers, red onion) o Lentil soup with celery, carrots made with vegetable broth, garlic, salt and pepper  o Tabouli salad: parsley, bulgur, mint, scallions, cucumbers, tomato, radishes, lemon juice, olive oil, salt and pepper.

## 2017-06-18 NOTE — Progress Notes (Signed)
CC: follow up after hospitalization for acute kidney injury, to establish care in the clinic  HPI:  Mr.Russell Cook is a 27 y.o. with PMH as listed below who presents for follow up after hospitalization for acute kidney injury, to establish care in the clinic . Please see the assessment and plans for the status of the patient chronic medical problems.   History reviewed. No pertinent past medical history.   History reviewed. No pertinent surgical history.  Family History  Problem Relation Age of Onset  . Hypertension Mother   . Hypertension Sister   . Diabetes Maternal Grandmother   . Hypertension Maternal Grandmother   . Stroke Maternal Grandmother   . Hypertension Sister    Social History   Socioeconomic History  . Marital status: Single    Spouse name: None  . Number of children: None  . Years of education: None  . Highest education level: None  Social Needs  . Financial resource strain: None  . Food insecurity - worry: None  . Food insecurity - inability: None  . Transportation needs - medical: None  . Transportation needs - non-medical: None  Occupational History  . None  Tobacco Use  . Smoking status: Current Every Day Smoker    Packs/day: 0.50    Years: 10.00    Pack years: 5.00    Types: Cigarettes  . Smokeless tobacco: Never Used  . Tobacco comment: 5-6cigs/day  Substance and Sexual Activity  . Alcohol use: Yes    Frequency: Never  . Drug use: Yes    Types: Marijuana  . Sexual activity: Yes    Birth control/protection: Coitus interruptus  Other Topics Concern  . None  Social History Narrative  . None   Review of Systems:  Refer to history of present illness and assessment and plans for pertinent review of systems, all others reviewed and negative  Physical Exam:  Vitals:   06/18/17 1023  BP: (!) 102/53  Pulse: 77  Temp: 98.2 F (36.8 C)  TempSrc: Oral  SpO2: 99%  Weight: 186 lb (84.4 kg)  Height: 5\' 8"  (1.727 m)   General: well  appearing, no acute distress  Cardiac: regular rate and rhythm, no peripheral edema  Pulm: lungs clear to auscultation, no respiratory distress  Gastrointestinal: soft, non distended, mild tenderness to palpation over the left lower quadrant Skin: no rashes evident on exposed skin of the arms, face, or neck  Psych: conversational, well groomed    Assessment & Plan:   Acute kidney injury  Admit 2/7 - 2/9 after he present with lower quadrant abdominal pain accompanied by diaphoresis and nausea and was found to be in acute kidney failure with crt 2.56 and GFR 33, and CK 1900. Urinalysis showed trace hgb with 0-5 RBCs, ketones, non nephrotic range proteinuria and elevated specific gravity. Repeat urinalysis the day after admission and with gently IV fluids showed ketones and was negative for hemoglobin or protein. CT of the abdomen and pelvis showed a normal ureter and bladder with no renal abnormality. 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 He has no history of hypertension or diabetes and is not on any regular medications. He had not had any recent contrast studies. Since the time of discharge he has been drinking 3 x 24- ounce waters daily. The abdominal pain and nausea have resolved completely.  At the time of discharge Crt was 1.12 and GFR > 60.  - BMP today > crt 1.3 and  GFR 73 > continue to hydrate and return to the clinic in two weeks to recheck BMP   Tobacco use  5 pack year smoking history. Precontemplative about quitting.  - encouraged 1 800 quit line  - RTC if he changes his mind about quitting   See Encounters Tab for problem based charting.  Patient discussed with Dr. Heide SparkNarendra

## 2017-06-19 LAB — BMP8+ANION GAP
Anion Gap: 15 mmol/L (ref 10.0–18.0)
BUN / CREAT RATIO: 7 — AB (ref 9–20)
BUN: 10 mg/dL (ref 6–20)
CALCIUM: 9.8 mg/dL (ref 8.7–10.2)
CO2: 22 mmol/L (ref 20–29)
CREATININE: 1.34 mg/dL — AB (ref 0.76–1.27)
Chloride: 102 mmol/L (ref 96–106)
GFR, EST AFRICAN AMERICAN: 85 mL/min/{1.73_m2} (ref >59)
GFR, EST NON AFRICAN AMERICAN: 73 mL/min/{1.73_m2} (ref >59)
Glucose: 85 mg/dL (ref 65–99)
Potassium: 4.5 mmol/L (ref 3.5–5.2)
Sodium: 139 mmol/L (ref 134–144)

## 2017-06-22 ENCOUNTER — Encounter: Payer: Self-pay | Admitting: Internal Medicine

## 2017-06-22 DIAGNOSIS — F17200 Nicotine dependence, unspecified, uncomplicated: Secondary | ICD-10-CM | POA: Insufficient documentation

## 2017-06-22 NOTE — Progress Notes (Signed)
Internal Medicine Clinic Attending  Case discussed with Dr. Blum at the time of the visit.  We reviewed the resident's history and exam and pertinent patient test results.  I agree with the assessment, diagnosis, and plan of care documented in the resident's note. 

## 2017-06-22 NOTE — Assessment & Plan Note (Signed)
Admit 2/7 - 2/9 after he present with lower quadrant abdominal pain accompanied by diaphoresis and nausea and was found to be in acute kidney failure with crt 2.56 and GFR 33, and CK 1900. Urinalysis showed trace hgb with 0-5 RBCs, ketones, non nephrotic range proteinuria and elevated specific gravity. Repeat urinalysis the day after admission and with gently IV fluids showed ketones and was negative for hemoglobin or protein. CT of the abdomen and pelvis showed a normal ureter and bladder with no renal abnormality. 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 He has no history of hypertension or diabetes and is not on any regular medications. He had not had any recent contrast studies. Since the time of discharge he has been drinking 3 x 24- ounce waters daily. The abdominal pain and nausea have resolved completely.  At the time of discharge Crt was 1.12 and GFR > 60.  - BMP today > crt 1.3 and GFR 73 > continue to hydrate and return to the clinic in two weeks to recheck BMP

## 2017-06-22 NOTE — Assessment & Plan Note (Signed)
5 pack year smoking history. Precontemplative about quitting.  - encouraged 1 800 quit line  - RTC if he changes his mind about quitting

## 2017-07-05 ENCOUNTER — Ambulatory Visit: Payer: Self-pay

## 2017-07-07 ENCOUNTER — Ambulatory Visit: Payer: Self-pay

## 2017-07-13 ENCOUNTER — Ambulatory Visit: Payer: Self-pay

## 2018-06-15 ENCOUNTER — Emergency Department (HOSPITAL_BASED_OUTPATIENT_CLINIC_OR_DEPARTMENT_OTHER)
Admission: EM | Admit: 2018-06-15 | Discharge: 2018-06-15 | Disposition: A | Discharge status: Home / Self Care | Payer: Self-pay | Attending: Emergency Medicine | Admitting: Emergency Medicine

## 2018-06-15 ENCOUNTER — Other Ambulatory Visit: Payer: Self-pay

## 2018-06-15 ENCOUNTER — Encounter (HOSPITAL_BASED_OUTPATIENT_CLINIC_OR_DEPARTMENT_OTHER): Payer: Self-pay | Admitting: Emergency Medicine

## 2018-06-15 DIAGNOSIS — R319 Hematuria, unspecified: Secondary | ICD-10-CM | POA: Insufficient documentation

## 2018-06-15 DIAGNOSIS — F17210 Nicotine dependence, cigarettes, uncomplicated: Secondary | ICD-10-CM | POA: Insufficient documentation

## 2018-06-15 DIAGNOSIS — Z113 Encounter for screening for infections with a predominantly sexual mode of transmission: Secondary | ICD-10-CM | POA: Insufficient documentation

## 2018-06-15 DIAGNOSIS — R8271 Bacteriuria: Secondary | ICD-10-CM | POA: Insufficient documentation

## 2018-06-15 DIAGNOSIS — R300 Dysuria: Secondary | ICD-10-CM | POA: Insufficient documentation

## 2018-06-15 DIAGNOSIS — R369 Urethral discharge, unspecified: Secondary | ICD-10-CM | POA: Insufficient documentation

## 2018-06-15 LAB — URINALYSIS, MICROSCOPIC (REFLEX): WBC, UA: >50 WBC/hpf (ref 0–5)

## 2018-06-15 LAB — URINALYSIS, ROUTINE W REFLEX MICROSCOPIC
Glucose, UA: NEGATIVE mg/dL
Ketones, ur: 15 mg/dL — AB
Nitrite: NEGATIVE
PROTEIN: 100 mg/dL — AB
SPECIFIC GRAVITY, URINE: 1.025 (ref 1.005–1.030)
pH: 6 (ref 5.0–8.0)

## 2018-06-15 MED ORDER — AZITHROMYCIN 250 MG PO TABS
1000.0000 mg | ORAL_TABLET | Freq: Once | ORAL | Status: AC
  Administered 2018-06-15: 1000 mg via ORAL
  Filled 2018-06-15: qty 4

## 2018-06-15 MED ORDER — CEFTRIAXONE SODIUM 250 MG IJ SOLR
250.0000 mg | Freq: Once | INTRAMUSCULAR | Status: AC
  Administered 2018-06-15: 250 mg via INTRAMUSCULAR
  Filled 2018-06-15: qty 250

## 2018-06-15 MED ORDER — CEPHALEXIN 500 MG PO CAPS: 500.0000 mg | ORAL_CAPSULE | Freq: Four times a day (QID) | ORAL | 0 refills | Status: DC

## 2018-06-15 NOTE — ED Triage Notes (Signed)
C/o penile discharge since Thursday.

## 2018-06-15 NOTE — ED Provider Notes (Signed)
[facility] EMERGENCY DEPARTMENT Provider Note   CSN: [id number] Arrival date & time: 06/15/18  1347     History   Chief Complaint Chief Complaint  Patient presents with  . Penile Discharge    HPI Russell Cook is a 28 y.o. male with a hx of tobacco abuse who presents to the ED with complaints of penile discharge over the past few days.  Patient states the discharge is yellow in color without specific alleviating or aggravating factors.  He has noted associated dysuria with onset of urine stream.  No intervention prior to arrival.  He is currently sexually active with 2 male partners and states he uses condoms consistently.  Denies fever, chills, vomiting, testicular pain/swelling, pain with bowel movements, or abdominal pain. Denies hx of prior STDs.   HPI  History reviewed. No pertinent past medical history.  Patient Active Problem List   Diagnosis Date Noted  . Tobacco use disorder 06/22/2017  . Acute kidney injury (HCC) 06/11/2017    History reviewed. No pertinent surgical history.      Home Medications    Prior to Admission medications   Not on File    Family History Family History  Problem Relation Age of Onset  . Hypertension Mother   . Hypertension Sister   . Diabetes Maternal Grandmother   . Hypertension Maternal Grandmother   . Stroke Maternal Grandmother   . Hypertension Sister     Social History Social History   Tobacco Use  . Smoking status: Current Every Day Smoker    Packs/day: 0.50    Years: 10.00    Pack years: 5.00    Types: Cigarettes  . Smokeless tobacco: Never Used  . Tobacco comment: 5-6cigs/day  Substance Use Topics  . Alcohol use: Yes    Frequency: Never  . Drug use: Yes    Types: Marijuana     Allergies   Penicillins   Review of Systems Review of Systems  Constitutional: Negative for chills and fever.  Gastrointestinal: Negative for abdominal pain, nausea, rectal pain and vomiting.  Genitourinary:  Positive for discharge and dysuria. Negative for flank pain, scrotal swelling and testicular pain.    Physical Exam Updated Vital Signs BP 118/63   Pulse 84   Temp 98.1 F (36.7 C)   Resp 18   Ht 5\' 8"  (1.727 m)   Wt 79.4 kg   SpO2 96%   BMI 26.61 kg/m   Physical Exam Vitals signs and nursing note reviewed. Exam conducted with a chaperone present.  Constitutional:      General: He is not in acute distress.    Appearance: He is well-developed. He is not toxic-appearing.  HENT:     Head: Normocephalic and atraumatic.  Eyes:     General:        Right eye: No discharge.        Left eye: No discharge.     Conjunctiva/sclera: Conjunctivae normal.  Neck:     Musculoskeletal: Neck supple.  Cardiovascular:     Rate and Rhythm: Normal rate and regular rhythm.  Pulmonary:     Effort: Pulmonary effort is normal. No respiratory distress.     Breath sounds: Normal breath sounds. No wheezing, rhonchi or rales.  Abdominal:     General: There is no distension.     Palpations: Abdomen is soft.     Tenderness: There is no abdominal tenderness. There is no guarding or rebound.  Genitourinary:    Pubic Area: No rash.  Penis: Circumcised. Discharge (very small amount of white to yellow colored discharge at the uretheral meatus) present. No erythema, swelling or lesions.      Scrotum/Testes: Normal.        Right: Mass, tenderness or swelling not present.        Left: Mass, tenderness or swelling not present.     Epididymis:     Right: Normal.     Left: Normal.  Skin:    General: Skin is warm and dry.     Findings: No rash.  Neurological:     Mental Status: He is alert.     Comments: Clear speech.   Psychiatric:        Behavior: Behavior normal.      ED Treatments / Results  Labs (all labs ordered are listed, but only abnormal results are displayed) Labs Reviewed  URINALYSIS, ROUTINE W REFLEX MICROSCOPIC - Abnormal; Notable for the following components:      Result Value     APPearance CLOUDY (*)    Hgb urine dipstick SMALL (*)    Bilirubin Urine MODERATE (*)    Ketones, ur 15 (*)    Protein, ur 100 (*)    Leukocytes,Ua LARGE (*)    All other components within normal limits  URINALYSIS, MICROSCOPIC (REFLEX) - Abnormal; Notable for the following components:   Bacteria, UA MANY (*)    All other components within normal limits  GC/CHLAMYDIA PROBE AMP ([place]) NOT AT [facility]    EKG None  Radiology No results found.  Procedures Procedures (including critical care time)  Medications Ordered in ED Medications  cefTRIAXone (ROCEPHIN) injection 250 mg (250 mg Intramuscular Given 06/15/18 1554)  azithromycin (ZITHROMAX) tablet 1,000 mg (1,000 mg Oral Given 06/15/18 1553)     Initial Impression / Assessment and Plan / ED Course  I have reviewed the triage vital signs and the nursing notes.  Pertinent labs & imaging results that were available during my care of the patient were reviewed by me and considered in my medical decision making (see chart for details).   Patient presents to the emergency department with complaints of penile discharge.  Patient nontoxic-appearing, no apparent distress, vitals WNL.  Urinalysis per triage team with multiple abnormalities including many bacteria as well as a large amount of leukocytes with hematuria-suspect this is more likely secondary to potential STD as opposed to true UTI, however will cover with keflex in the time being & will culture the urine. There are also concerning for dehydration, patient states he does not drink very much water, encouraged hydration with a urinalysis rechecked by his primary care provider given ketones, protein, hgb, & bili.  Gonorrhea and chlamydia testing were added onto urine by triage team.  We have also ordered HIV and syphilis testing as well.  STD test results remain pending, patient elected prophylaxis with ceftriaxone and azithromycin, he reports a penicillin allergy that is unknown  from childhood but he has had amoxicillin without issues therefore feel ceftriaxone administration is reasonable.  We discussed the importance of protection when sexually active, as well as the need to inform all sexual partners should test results return positive.  We discussed follow-up with the health department & PCP. I discussed results, treatment plan, need for follow-up, and return precautions with the patient. Provided opportunity for questions, patient confirmed understanding and is in agreement with plan.    Final Clinical Impressions(s) / ED Diagnoses   Final diagnoses:  Penile discharge  Hematuria, unspecified type  Bacteriuria  ED Discharge Orders         Ordered    cephALEXin (KEFLEX) 500 MG capsule  4 times daily     06/15/18 [address]1603           Tirrell Buchberger R, PA-C 06/15/18 1605    Maia PlanLong, Joshua G, MD 06/16/18 1039

## 2018-06-15 NOTE — ED Notes (Signed)
Nurse first-pt seated in ED WR-phone in hand-NAD

## 2018-06-15 NOTE — ED Notes (Signed)
Nurse was witness for testicular exam. Pt c/o painless penile discharge.

## 2018-06-15 NOTE — Discharge Instructions (Addendum)
You were seen in the ER today for penile discharge.  Your urine showed bacteria that is somewhat concerning for a UTI, this could also be due to an STD.  You were treated with antibiotics in the ER for possible gonorrhea and chlamydia, we will call you if your gonorrhea, chlamydia, syphilis, or HIV test returned positive.  If positive you will need to inform all sexual partners and seek care at the health department.  Please avoid intercourse for 14 days to prevent reinfection.  You are also needed with Keflex, an antibiotic, to cover for a possible UTI, take this as prescribed.  We have prescribed you new medication(s) today. Discuss the medications prescribed today with your pharmacist as they can have adverse effects and interactions with your other medicines including over the counter and prescribed medications. Seek medical evaluation if you start to experience new or abnormal symptoms after taking one of these medicines, seek care immediately if you start to experience difficulty breathing, feeling of your throat closing, facial swelling, or rash as these could be indications of a more serious allergic reaction  Additionally your urine had some blood, bilirubin, and ketones in it, it is poor and that this is rechecked by primary care provider within 1 to 2 weeks.  Return to the ER for new or worsening symptoms or any other concerns.

## 2018-06-16 LAB — URINE CULTURE: Culture: NO GROWTH

## 2018-06-16 LAB — HIV ANTIBODY (ROUTINE TESTING W REFLEX): HIV Screen 4th Generation wRfx: NONREACTIVE

## 2018-06-16 LAB — RPR: RPR: NONREACTIVE

## 2018-10-27 ENCOUNTER — Encounter: Payer: Self-pay | Admitting: *Deleted

## 2018-11-08 IMAGING — CT CT ABD-PELV W/O CM
2 of 4 series · 16 of 46 positions shown, 18 images · non-contrast
Comparison: None.

CLINICAL DATA: Acute onset of right lower quadrant abdominal pain.

EXAM:
CT ABDOMEN AND PELVIS WITHOUT CONTRAST
TECHNIQUE: Multidetector CT imaging of the abdomen and pelvis was performed
following the standard protocol without IV contrast.

[Series 2: axial st · axial · 0.75mm/px · z∈[-456,-36]mm · 13 of 94 slices shown, 15 images]
[im 5/94  soft-tissue]
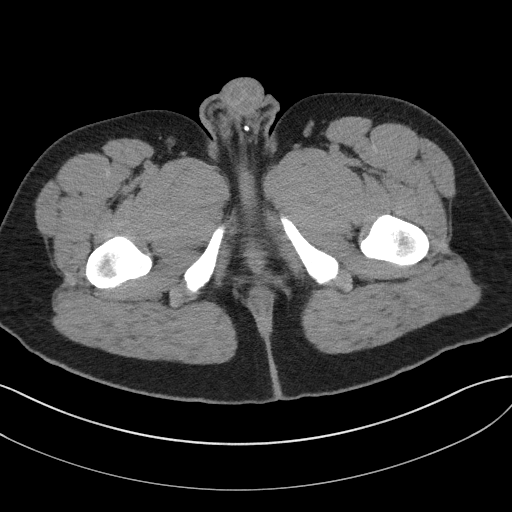
[im 5/94  bone]
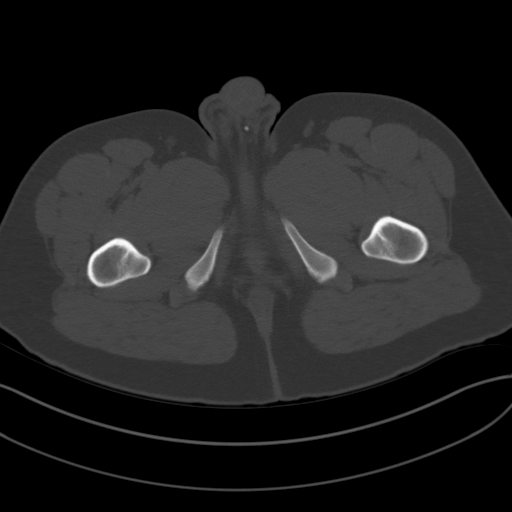
[im 13/94  soft-tissue]
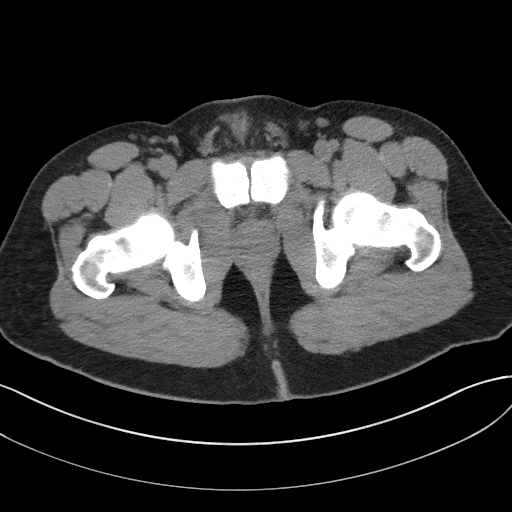
[im 21/94  soft-tissue]
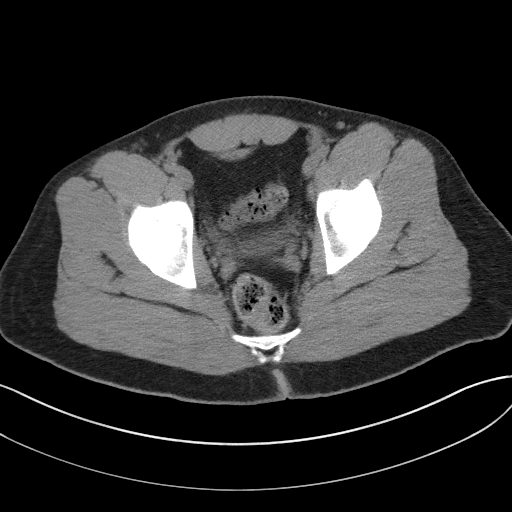
[im 25/94  soft-tissue]
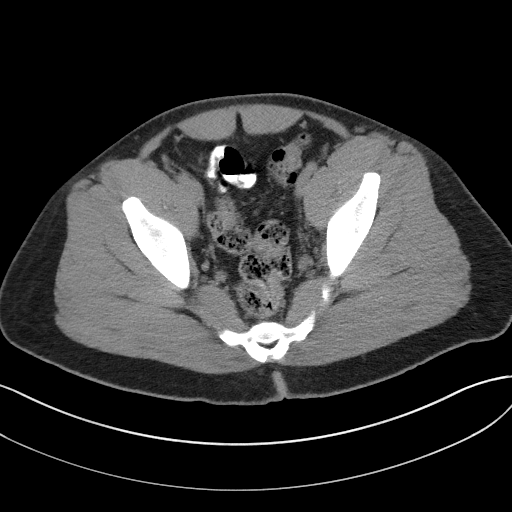
[im 33/94  soft-tissue]
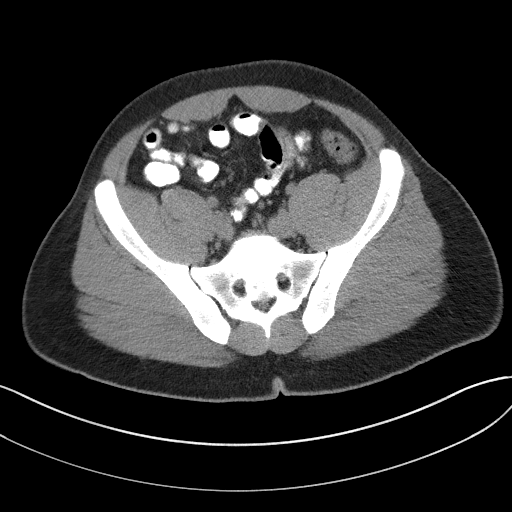
[im 41/94  soft-tissue]
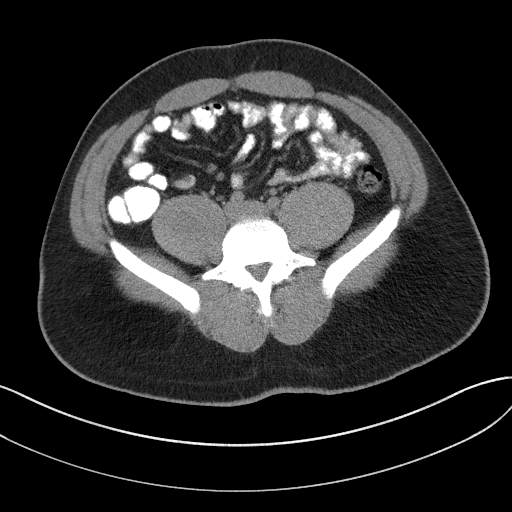
[im 49/94  soft-tissue]
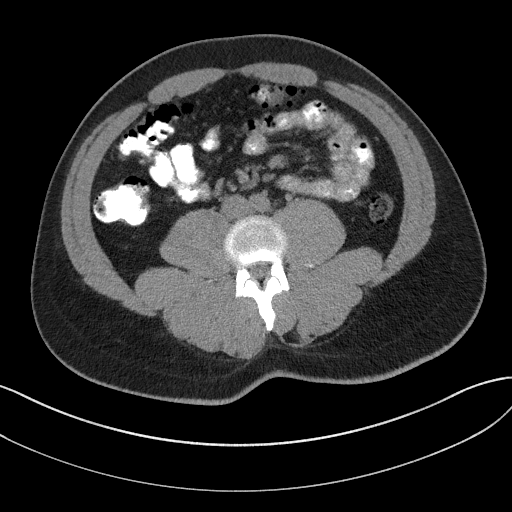
[im 53/94  soft-tissue]
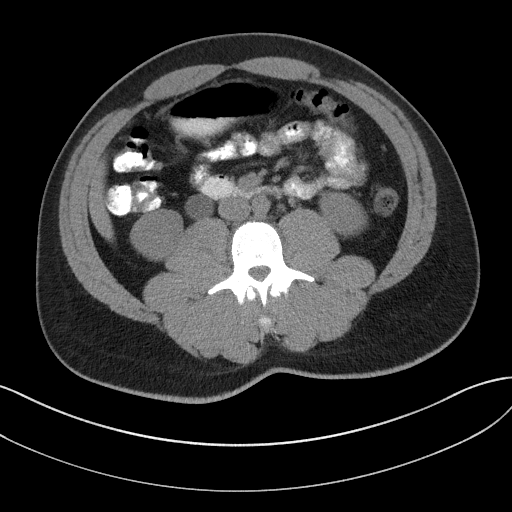
[im 61/94  soft-tissue]
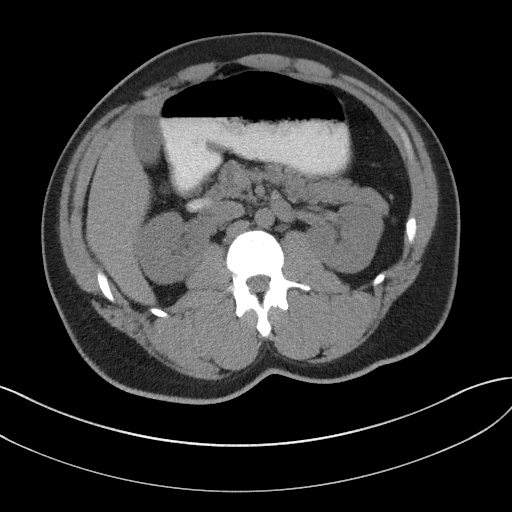
[im 61/94  bone]
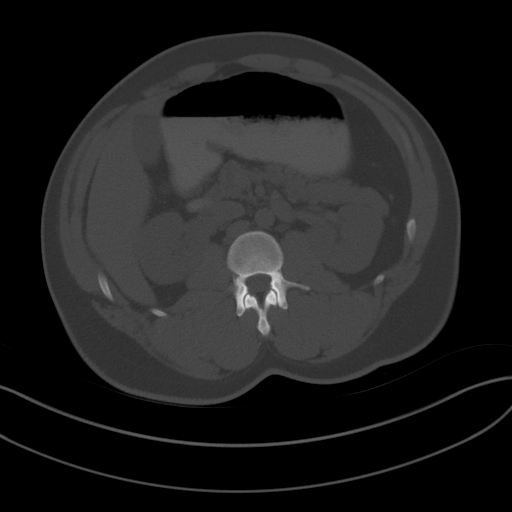
[im 69/94  soft-tissue]
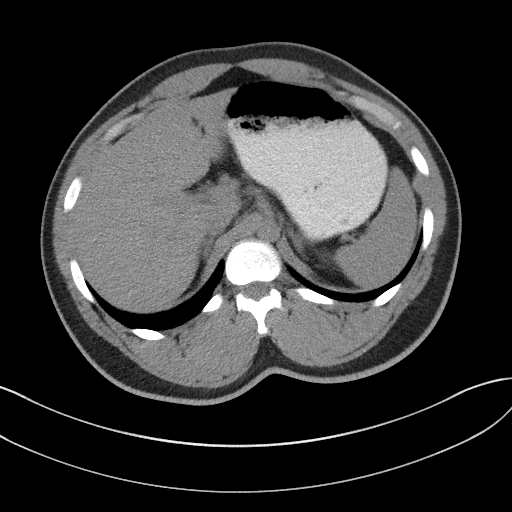
[im 73/94  soft-tissue]
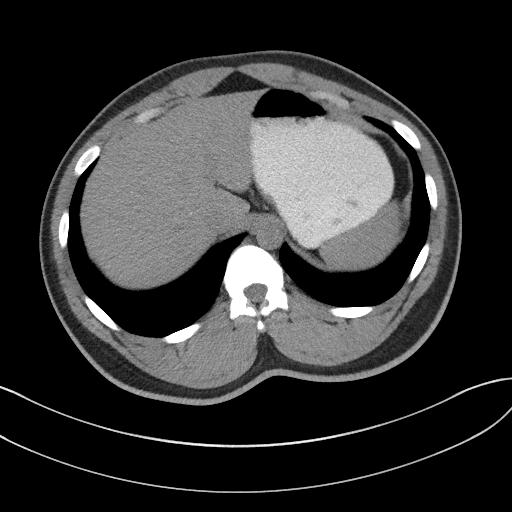
[im 81/94  soft-tissue]
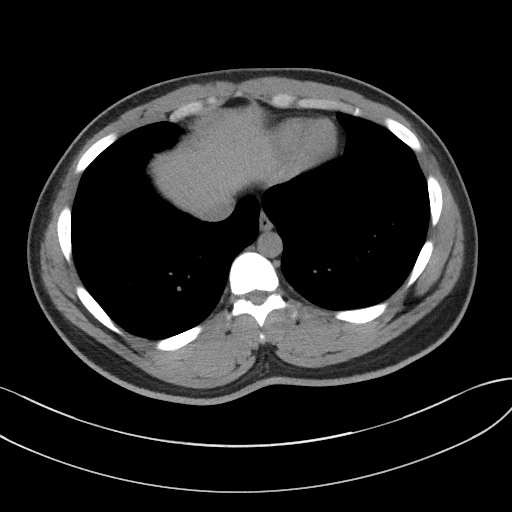
[im 89/94  soft-tissue]
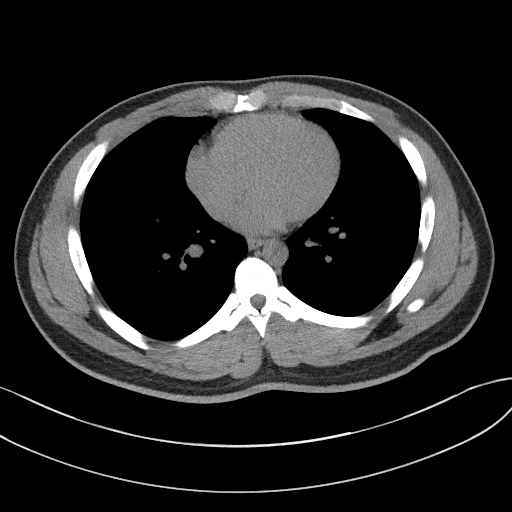

[Series 5: coronal st · coronal · 0.71mm/px · 3 of 92 slices shown]
[im 31/92  soft-tissue]
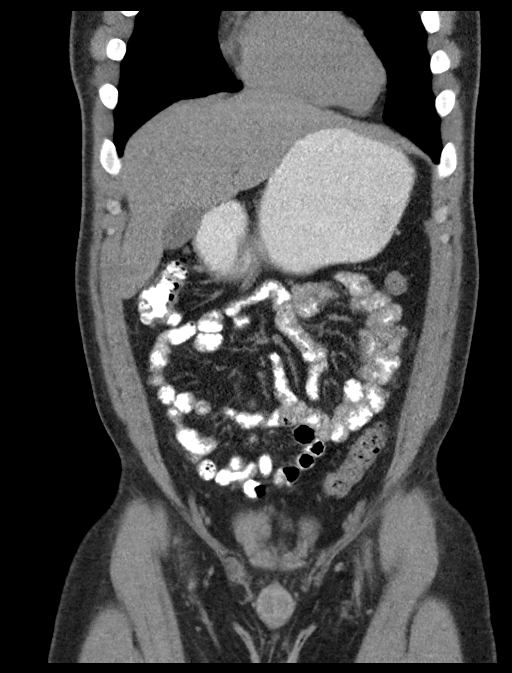
[im 41/92  soft-tissue]
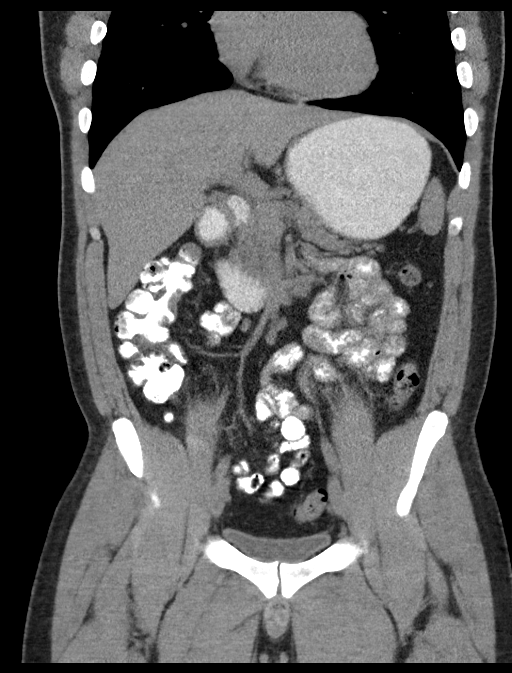
[im 51/92  soft-tissue]
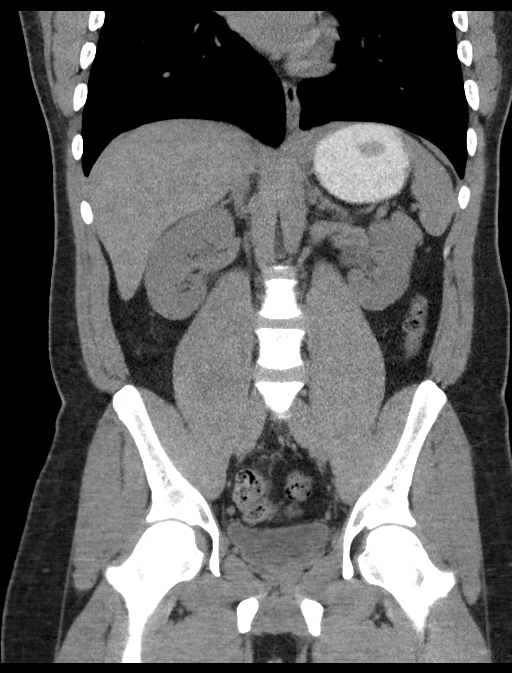

[16 of 46 positions shown; findings below may reference images not displayed]

FINDINGS: Lower chest: The visualized lung bases are grossly clear. The
visualized portions of the mediastinum are unremarkable.

Hepatobiliary: The liver is unremarkable in appearance. The
gallbladder is unremarkable in appearance. The common bile duct
remains normal in caliber.

Pancreas: The pancreas is within normal limits.

Spleen: The spleen is unremarkable in appearance.

Adrenals/Urinary Tract: The adrenal glands are unremarkable in
appearance. The kidneys are within normal limits. There is no
evidence of hydronephrosis. No renal or ureteral stones are
identified. No perinephric stranding is seen. A right-sided
extrarenal pelvis is noted.

Stomach/Bowel: The stomach is unremarkable in appearance. The small
bowel is within normal limits. The appendix is normal in caliber,
without evidence of appendicitis. The colon is unremarkable in
appearance.

Vascular/Lymphatic: The abdominal aorta is unremarkable in
appearance. The inferior vena cava is grossly unremarkable. No
retroperitoneal lymphadenopathy is seen. No pelvic sidewall
lymphadenopathy is identified.

Reproductive: The bladder is mildly distended and grossly
unremarkable. The prostate remains normal in size.

Other: No additional soft tissue abnormalities are seen.

Musculoskeletal: No acute osseous abnormalities are identified. The
visualized musculature is unremarkable in appearance.
IMPRESSION: Unremarkable noncontrast CT of the abdomen and pelvis. No evidence
of appendicitis.

## 2019-07-20 ENCOUNTER — Emergency Department (HOSPITAL_BASED_OUTPATIENT_CLINIC_OR_DEPARTMENT_OTHER)
Admission: EM | Admit: 2019-07-20 | Discharge: 2019-07-20 | Disposition: A | Discharge status: Home / Self Care | Payer: No Typology Code available for payment source | Attending: Emergency Medicine | Admitting: Emergency Medicine

## 2019-07-20 ENCOUNTER — Other Ambulatory Visit: Payer: Self-pay

## 2019-07-20 ENCOUNTER — Emergency Department (HOSPITAL_BASED_OUTPATIENT_CLINIC_OR_DEPARTMENT_OTHER): Payer: No Typology Code available for payment source

## 2019-07-20 ENCOUNTER — Encounter (HOSPITAL_BASED_OUTPATIENT_CLINIC_OR_DEPARTMENT_OTHER): Payer: Self-pay | Admitting: Emergency Medicine

## 2019-07-20 DIAGNOSIS — R109 Unspecified abdominal pain: Secondary | ICD-10-CM

## 2019-07-20 DIAGNOSIS — F17210 Nicotine dependence, cigarettes, uncomplicated: Secondary | ICD-10-CM | POA: Diagnosis not present

## 2019-07-20 DIAGNOSIS — R1031 Right lower quadrant pain: Secondary | ICD-10-CM | POA: Diagnosis present

## 2019-07-20 DIAGNOSIS — M545 Low back pain: Secondary | ICD-10-CM | POA: Diagnosis not present

## 2019-07-20 DIAGNOSIS — R0789 Other chest pain: Secondary | ICD-10-CM | POA: Insufficient documentation

## 2019-07-20 LAB — URINALYSIS, ROUTINE W REFLEX MICROSCOPIC
Bilirubin Urine: NEGATIVE
Glucose, UA: NEGATIVE mg/dL
Hgb urine dipstick: NEGATIVE
Ketones, ur: NEGATIVE mg/dL
Leukocytes,Ua: NEGATIVE
Nitrite: NEGATIVE
Protein, ur: NEGATIVE mg/dL
Specific Gravity, Urine: 1.025 (ref 1.005–1.030)
pH: 6.5 (ref 5.0–8.0)

## 2019-07-20 MED ORDER — IBUPROFEN 800 MG PO TABS: 800.0000 mg | ORAL_TABLET | Freq: Three times a day (TID) | ORAL | 0 refills | Status: DC | PRN

## 2019-07-20 MED ORDER — METHOCARBAMOL 500 MG PO TABS: 500.0000 mg | ORAL_TABLET | Freq: Three times a day (TID) | ORAL | 0 refills | Status: DC | PRN
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
## 2020-05-16 ENCOUNTER — Other Ambulatory Visit: Payer: Self-pay

## 2020-05-16 ENCOUNTER — Other Ambulatory Visit: Payer: Self-pay | Admitting: Internal Medicine

## 2020-05-16 DIAGNOSIS — R0989 Other specified symptoms and signs involving the circulatory and respiratory systems: Secondary | ICD-10-CM

## 2020-05-16 NOTE — Progress Notes (Signed)
Accompanying our patient, Russell Cook, with similar illness Has runny nose--would like to be tested for COVID No fever or cough.  COVID testing negative today.

## 2020-08-27 ENCOUNTER — Other Ambulatory Visit: Payer: Self-pay

## 2020-08-27 ENCOUNTER — Other Ambulatory Visit: Payer: Self-pay | Admitting: Internal Medicine

## 2020-08-27 DIAGNOSIS — R059 Cough, unspecified: Secondary | ICD-10-CM

## 2020-08-27 DIAGNOSIS — U071 COVID-19: Secondary | ICD-10-CM

## 2020-08-27 LAB — POC COVID19 BINAXNOW: SARS Coronavirus 2 Ag: POSITIVE — AB

## 2020-08-27 MED ORDER — NIRMATRELVIR/RITONAVIR (PAXLOVID)TABLET: ORAL_TABLET | ORAL | 0 refills | Status: AC

## 2020-08-27 NOTE — Progress Notes (Signed)
Not a patient here, but mother tested positive for COVID yesterday with symptoms. He developed symptoms, Saturday, April 23rd in early morning. Exposed to mother with symptoms the day before. He has congestion and cough.  No dyspnea, though feels congestion is in his chest. He did not bring his vaccine card and cannot recall which vaccine he obtained. Not sure when vaccinated, but did obtain first 2 in series. Found in CVMS:  Pfizer with second dose 01/27/2020 Has not been boosted Smokes--encouraged to avoid during illness and then continue to avoid.  No meds  Allergies:  PCN--does not know allergic reaction.  PMH:  Unremarkable.  + COVID antigen testing.  A/P:  COVID Rx for Paxlovid to take twice daily for 5 days.  Call if problems.   Discussed proper self isolation and criteria for return to work. Note for work given. To find N 95 or KN 95 masks for himself and mother once completes home isolation. To notify contacts starting 2 days before symptoms through today.
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
